# Patient Record
Sex: Female | Born: 1955 | Race: Black or African American | Hispanic: No | Smoking: Former smoker
Health system: Southern US, Community
[De-identification: ages and names within clinical notes are randomized; demographics above are authoritative.]

## PROBLEM LIST (undated history)

## (undated) DIAGNOSIS — Z0000 Encounter for general adult medical examination without abnormal findings: Secondary | ICD-10-CM

## (undated) DIAGNOSIS — I10 Essential (primary) hypertension: Secondary | ICD-10-CM

## (undated) DIAGNOSIS — M1990 Unspecified osteoarthritis, unspecified site: Secondary | ICD-10-CM

## (undated) DIAGNOSIS — Z1231 Encounter for screening mammogram for malignant neoplasm of breast: Secondary | ICD-10-CM

## (undated) DIAGNOSIS — M545 Low back pain, unspecified: Secondary | ICD-10-CM

## (undated) DIAGNOSIS — M25539 Pain in unspecified wrist: Secondary | ICD-10-CM

## (undated) DIAGNOSIS — E669 Obesity, unspecified: Secondary | ICD-10-CM

## (undated) DIAGNOSIS — M7661 Achilles tendinitis, right leg: Secondary | ICD-10-CM

## (undated) DIAGNOSIS — M419 Scoliosis, unspecified: Secondary | ICD-10-CM

## (undated) DIAGNOSIS — E785 Hyperlipidemia, unspecified: Secondary | ICD-10-CM

## (undated) HISTORY — DX: Essential (primary) hypertension: I10

## (undated) HISTORY — PX: BREAST LUMPECTOMY: SHX2

## (undated) HISTORY — DX: Scoliosis, unspecified: M41.9

## (undated) HISTORY — DX: Encounter for screening mammogram for malignant neoplasm of breast: Z12.31

## (undated) HISTORY — DX: Low back pain: M54.5

## (undated) HISTORY — DX: Encounter for general adult medical examination without abnormal findings: Z00.00

## (undated) HISTORY — DX: Unspecified osteoarthritis, unspecified site: M19.90

## (undated) HISTORY — DX: Low back pain, unspecified: M54.50

## (undated) HISTORY — DX: Achilles tendinitis, right leg: M76.61

## (undated) HISTORY — DX: Pain in unspecified wrist: M25.539

## (undated) HISTORY — DX: Hyperlipidemia, unspecified: E78.5

## (undated) HISTORY — PX: TUBAL LIGATION: SHX77

---

## 1997-10-17 ENCOUNTER — Emergency Department (HOSPITAL_COMMUNITY): Admission: EM | Admit: 1997-10-17 | Discharge: 1997-10-17 | Payer: Self-pay | Admitting: Emergency Medicine

## 2007-07-18 ENCOUNTER — Emergency Department (HOSPITAL_COMMUNITY): Admission: EM | Admit: 2007-07-18 | Discharge: 2007-07-18 | Payer: Self-pay | Admitting: Family Medicine

## 2009-09-10 ENCOUNTER — Emergency Department (HOSPITAL_COMMUNITY): Admission: EM | Admit: 2009-09-10 | Discharge: 2009-09-10 | Payer: Self-pay | Admitting: Family Medicine

## 2010-09-02 ENCOUNTER — Emergency Department (HOSPITAL_COMMUNITY)
Admission: EM | Admit: 2010-09-02 | Discharge: 2010-09-02 | Disposition: A | Discharge status: Home / Self Care | Payer: Medicaid Other | Attending: Emergency Medicine | Admitting: Emergency Medicine

## 2010-09-02 DIAGNOSIS — Z711 Person with feared health complaint in whom no diagnosis is made: Secondary | ICD-10-CM | POA: Insufficient documentation

## 2010-09-02 LAB — URINALYSIS, ROUTINE W REFLEX MICROSCOPIC
Bilirubin Urine: NEGATIVE
Glucose, UA: NEGATIVE mg/dL
Ketones, ur: NEGATIVE mg/dL
Protein, ur: NEGATIVE mg/dL
Specific Gravity, Urine: 1.022 (ref 1.005–1.030)
Urobilinogen, UA: 1 mg/dL (ref 0.0–1.0)
pH: 6 (ref 5.0–8.0)

## 2010-09-02 LAB — URINE MICROSCOPIC-ADD ON

## 2010-09-04 LAB — POCT I-STAT, CHEM 8
Calcium, Ion: 1.14 mmol/L (ref 1.12–1.32)
Creatinine, Ser: 1 mg/dL (ref 0.4–1.2)
Glucose, Bld: 130 mg/dL — ABNORMAL HIGH (ref 70–99)
HCT: 37 % (ref 36.0–46.0)
Hemoglobin: 12.6 g/dL (ref 12.0–15.0)
Sodium: 141 mEq/L (ref 135–145)
TCO2: 27 mmol/L (ref 0–100)

## 2010-11-22 ENCOUNTER — Emergency Department (HOSPITAL_COMMUNITY): Payer: Medicaid Other

## 2010-11-22 ENCOUNTER — Emergency Department (HOSPITAL_COMMUNITY)
Admission: EM | Admit: 2010-11-22 | Discharge: 2010-11-23 | Disposition: A | Discharge status: Home / Self Care | Payer: Medicaid Other | Attending: Emergency Medicine | Admitting: Emergency Medicine

## 2010-11-22 DIAGNOSIS — R1013 Epigastric pain: Secondary | ICD-10-CM | POA: Insufficient documentation

## 2010-11-22 DIAGNOSIS — R112 Nausea with vomiting, unspecified: Secondary | ICD-10-CM | POA: Insufficient documentation

## 2010-11-22 DIAGNOSIS — E669 Obesity, unspecified: Secondary | ICD-10-CM | POA: Insufficient documentation

## 2010-11-22 DIAGNOSIS — K219 Gastro-esophageal reflux disease without esophagitis: Secondary | ICD-10-CM | POA: Insufficient documentation

## 2010-11-22 LAB — CBC
HCT: 38.5 % (ref 36.0–46.0)
Hemoglobin: 13.2 g/dL (ref 12.0–15.0)
MCH: 29.7 pg (ref 26.0–34.0)
Platelets: 324 10*3/uL (ref 150–400)
RBC: 4.44 MIL/uL (ref 3.87–5.11)
WBC: 10.9 10*3/uL — ABNORMAL HIGH (ref 4.0–10.5)

## 2010-11-22 LAB — URINALYSIS, ROUTINE W REFLEX MICROSCOPIC
Hgb urine dipstick: NEGATIVE
Specific Gravity, Urine: 1.021 (ref 1.005–1.030)
Urobilinogen, UA: 1 mg/dL (ref 0.0–1.0)
pH: 5 (ref 5.0–8.0)

## 2010-11-22 LAB — COMPREHENSIVE METABOLIC PANEL
ALT: 19 U/L (ref 0–35)
AST: 19 U/L (ref 0–37)
Albumin: 4 g/dL (ref 3.5–5.2)
CO2: 29 mEq/L (ref 19–32)
Calcium: 10 mg/dL (ref 8.4–10.5)
GFR calc non Af Amer: 50 mL/min — ABNORMAL LOW (ref >60)
Sodium: 139 mEq/L (ref 135–145)
Total Bilirubin: 0.4 mg/dL (ref 0.3–1.2)
Total Protein: 8.4 g/dL — ABNORMAL HIGH (ref 6.0–8.3)

## 2010-11-22 LAB — DIFFERENTIAL
Basophils Relative: 0 % (ref 0–1)
Eosinophils Relative: 2 % (ref 0–5)
Monocytes Relative: 5 % (ref 3–12)

## 2010-11-22 LAB — LIPASE, BLOOD: Lipase: 27 U/L (ref 11–59)

## 2010-11-22 LAB — URINE MICROSCOPIC-ADD ON

## 2010-11-22 LAB — GLUCOSE, CAPILLARY

## 2010-11-25 ENCOUNTER — Encounter: Payer: Self-pay | Admitting: Internal Medicine

## 2010-11-25 ENCOUNTER — Ambulatory Visit (INDEPENDENT_AMBULATORY_CARE_PROVIDER_SITE_OTHER): Payer: Medicaid Other | Admitting: Internal Medicine

## 2010-11-25 DIAGNOSIS — M7661 Achilles tendinitis, right leg: Secondary | ICD-10-CM | POA: Insufficient documentation

## 2010-11-25 DIAGNOSIS — M419 Scoliosis, unspecified: Secondary | ICD-10-CM | POA: Insufficient documentation

## 2010-11-25 DIAGNOSIS — M1990 Unspecified osteoarthritis, unspecified site: Secondary | ICD-10-CM

## 2010-11-25 DIAGNOSIS — M7660 Achilles tendinitis, unspecified leg: Secondary | ICD-10-CM

## 2010-11-25 DIAGNOSIS — Z0000 Encounter for general adult medical examination without abnormal findings: Secondary | ICD-10-CM

## 2010-11-25 DIAGNOSIS — M4120 Other idiopathic scoliosis, site unspecified: Secondary | ICD-10-CM

## 2010-11-25 DIAGNOSIS — I10 Essential (primary) hypertension: Secondary | ICD-10-CM | POA: Insufficient documentation

## 2010-11-25 DIAGNOSIS — Z23 Encounter for immunization: Secondary | ICD-10-CM

## 2010-11-25 MED ORDER — HYDROCHLOROTHIAZIDE 25 MG PO TABS: 25.0000 mg | ORAL_TABLET | Freq: Every day | ORAL | Status: DC

## 2010-11-25 MED ORDER — TRAMADOL HCL 50 MG PO TABS: 50.0000 mg | ORAL_TABLET | Freq: Three times a day (TID) | ORAL | Status: DC | PRN

## 2010-11-25 NOTE — Assessment & Plan Note (Addendum)
Does not know if she has ever had her cholesterol checked, so she will come back on Friday for fasting lipids.   History of possible high blood sugars by outside provider, polyuria, and obesity.  Will check a fasting glucose to rule out diabetes. Patient interested in a colonoscopy once she has medicaid or an orange card.    Plan: Fasting glucose Fasting lipid panel Tdap today

## 2010-11-25 NOTE — Progress Notes (Deleted)
  Subjective:    Patient ID: Tiffany Pacheco, female    DOB: 10/10/1955, 55 y.o.   MRN: [id number]  HPI    Review of Systems     Objective:   Physical Exam        Assessment & Plan:

## 2010-11-25 NOTE — Assessment & Plan Note (Signed)
History given by patient from previous visit with [facility] orthopaedics as well as exam consistent with bilateral knee osteoarthritis, L>R.    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

## 2010-11-25 NOTE — Assessment & Plan Note (Addendum)
Physical exam is consistent with scoliosis.  Her back pain is significantly limiting her quality of life, so it warrants further evaluation and treatment.  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         Patient agreed to call us when her orange card or medicaid is set, so will wait to schedule this referral until then. Stop taking any NSAIDs (Aleve, Motrin, Advil) in the setting of borderline high creatinine 1.13 Stop taking gabapentin since it makes her sleepy and does not seem to be helping her musculoskeletal pain.  Her pain does not seem to have much of a neuropathic component.

## 2010-11-25 NOTE — Assessment & Plan Note (Signed)
BP is too low today.  Given history of high BP measurements and possible related headaches, will continue HCTZ but discontinue lisinopril for now.   Follow-up in 2 months to monitor HTN.

## 2010-11-25 NOTE — Patient Instructions (Signed)
Please return to clinic in 2 months.  Please return on Friday morning to have blood work.  Please call us once you have the orange card or medicaid and we will schedule your referral to sports medicine.  Please stop taking Gabapentin and Aleve.

## 2010-11-25 NOTE — Assessment & Plan Note (Signed)
Physical exam is consistent with insertional tendonitis of R achilles tendon.  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

## 2010-11-25 NOTE — Progress Notes (Signed)
Subjective:    Patient ID: Tiffany Pacheco, female    DOB: [birth year], 55 y.o.   MRN: [id number]  HPI Ms. Feldt is a 55 year old woman with a history of hypertension and osteoarthritis who presents to establish care in our clinic and with concern that she has may have diabetes.  She suspects diabetes because she has seen a nurse practitioner Hedwig Morton at an outside facility who found some high blood sugars.  Ms. Breault does have polyuria, but this is relatively recent and she thinks it is due to her new blood pressure medications lisinopril-hctz.  She also has occasional abdominal pain, but not very frequently.  She also notes occasional numbness in her toes, less than daily but at least once a week.  Of note, she has had glucoses between 95-130 at two ED visits this year.  She presented to the ED on the 22nd with nausea and vomiting, as well as burning pain in her throat.  She had a normal electrolytes and a normal EKG except for a PVC.  She was sent home with pepcid and prilosec.   She previously presented to the ED in May for headache and was found to have high blood pressure.  The initial pressure is not recorded in their note, but a recheck was 142/87.  She saw Hedwig Morton who found her pressure to be 131/103 per patient, and Ms. Sterba was started on Lisinopril-HCTZ 20-25mg .  She has been taking this since May.  She notes that before taking this she had headaches but they have since not been a problem.    Ms. Empson also has a history of back, L knee, and R ankle pain.  She has had back pain her whole life.  As a child her parents believed she had scoliosis, but she did not see a doctor for this until about 10 years ago when she saw chiropracter Dr. Okey Dupre in [place].  From him she received "some sort of electrotherapy".  Her back pain in aching and 6/10 currently, 10/10 at its worst.  The left knee pain has been a problem for a while.  She saw [facility] in either 2010 or 2011 for  this, who told her it was due to osteoarthritis.  They gave her an injection in the knee which helped.  The right ankle pain has been constant since the start of the year and a problem ever since she woke up in the morning with it one morning in 2009.  She remembers no associated trauma at the time, and she has never had an ankle sprain.    Her musculoskeletal pain significantly limits her quality of life.  She used to love to walk every day but now she never walks.  On bad days she sometimes just stays in bed.  She tried two Aleve tablets (over-the-counter) twice per day for a month or two back in February-March, but this did not help very much.  She has also tried tylenol which does not help very much.  Hedwig Morton prescribed Neurontin 300mg  TID.  Ms. Winters says this makes her too tired and she only takes it night.  It does not help her pain very much if at all.    She has frequent heartburn after eating.  This is mildly improved since being started on antacids from the ED.    Fam Hx: Mother: DM and HTN Aunt died of MI in her 35s Brother takes unknown "heart medication" Uncle and cousin died of  lung CA (both smoked) Uncle: DM  Soc Hx: She smoked for 2 yrs 1/2 ppd but stopped 20 years ago.  No alcohol or drug use. Her physical activity is limited by both pain and SOB.  She cannot climb a flight of stairs because of pain and if she did she thinks she would be SOB. She does not work currently.    Review of Systems Review of Systems - History obtained from the patient General ROS: negative for - chills, fatigue, fever or weight loss Respiratory ROS: no cough, shortness of breath, or wheezing Cardiovascular ROS: see HPI.  No palpitations. Gastrointestinal ROS: no abdominal pain, change in bowel habits, or black or bloody stools Genito-Urinary ROS: no dysuria, trouble voiding, or hematuria Musculoskeletal ROS: see HPI Neurological ROS: Positive for: see HPI.  negative for - memory loss or  weakness Dermatological ROS: negative rash        Objective:   Physical Exam  Filed Vitals:   11/25/10 1451  BP: 101/77  Pulse: 94  Temp: 96.5 F (35.8 C)   General: alert, well-developed, and cooperative to examination.  Head: normocephalic and atraumatic.  Eyes: vision grossly intact, pupils equal, pupils round, pupils reactive to light, no injection and anicteric.  Mouth: pharynx pink and moist, no erythema, and no exudates.   Neck: Full ROM in flexion and rotation.  ROM in extension limited by pain in lower back.  No JVD.   Lungs: normal respiratory effort, no accessory muscle use, normal breath sounds, no crackles, and no wheezes. Heart: normal rate, regular rhythm, no murmur, no gallop, and no rub.  Abdomen: soft, non-tender, normal bowel sounds, no distention, no guarding, no rebound tenderness.  Msk:  Back non-tender to palpation, but pain with standing and any back movement.   L knee has pain with flexion and extension, but no definite restriction of ROM.  Possible swelling surrounding bottom of patella laterally.  Negative McMurray's test.  Tender to palpation around most of the joint except over patella.  R knee has pain in extension but not flexion, no restriction of ROM.  No swelling.  Negative McMurray's.  Tender to palpation laterally and medially, but much less so than L knee. R ankle: tender to palpation only over the heel, most severe over insertion of achilles tendon.  Non tender around ankle medially, laterally, and anteriorly.  Plantarflexion against resistance causes pain. No joint warmth, no redness over any joints.   Pulses: 2+ DP/PT pulses bilaterally Extremities: No cyanosis, clubbing, edema  Neurologic: alert & oriented X3, cranial nerves II-XII intact, strength normal in all extremities, sensation intact to light touch.  Gait limited by back pain.  Sensation in fingertips and toes intact and symmetric to light touch and pinprick. Skin: turgor normal  and no rashes.  Psych: Oriented X3, memory intact for recent and remote, normally interactive, good eye contact, not anxious appearing, and not depressed appearing.        Assessment & Plan:

## 2010-11-26 NOTE — Progress Notes (Signed)
Assessment and plan discussed with Dr. Larey Seat. I agree with his entire assessment and plan.

## 2010-11-27 ENCOUNTER — Other Ambulatory Visit: Payer: Self-pay

## 2010-11-27 DIAGNOSIS — Z0000 Encounter for general adult medical examination without abnormal findings: Secondary | ICD-10-CM

## 2010-11-27 LAB — LIPID PANEL
Cholesterol: 241 mg/dL — ABNORMAL HIGH (ref 0–200)
HDL: 38 mg/dL — ABNORMAL LOW (ref >39)

## 2010-11-28 LAB — GLUCOSE, RANDOM: Glucose, Bld: 99 mg/dL (ref 70–99)

## 2010-12-17 ENCOUNTER — Telehealth: Payer: Self-pay | Admitting: *Deleted

## 2010-12-17 NOTE — Telephone Encounter (Signed)
Pt states she was given  tramadol for pain at last visit 7/25. Hx AO and back pain.  She has taken codeine in the past and is having the same side effects with the tramadol.  Sweating, nausea, and weakness.  She would like to try something else. Pharmacy - [facility] on [address]. Pt # [id number]

## 2010-12-18 ENCOUNTER — Other Ambulatory Visit: Payer: Self-pay | Admitting: Internal Medicine

## 2010-12-18 DIAGNOSIS — M1990 Unspecified osteoarthritis, unspecified site: Secondary | ICD-10-CM

## 2010-12-18 MED ORDER — DICLOFENAC SODIUM 1 % TD GEL: 1.0000 "application " | Freq: Four times a day (QID) | TRANSDERMAL | Status: DC | PRN

## 2010-12-18 NOTE — Telephone Encounter (Signed)
Patient may try Voltaren Gel applied to the back and/or knee up to four times per day.  I send in the prescription to her [facility] pharmacy.  She may also try Tylenol 500mg  two tablets up to four times per day as needed for pain.  Appreciate the help in setting up sports medicine referral for her.    Thank you!

## 2010-12-18 NOTE — Telephone Encounter (Signed)
I talked with Dr Yaakov Guthrie and he wanted me to check on Sports med referral. Pt does not have an orange card as she has not brought in her paperwork. It is noted that pt has Medicaid ....... So will not need the orange card. Lela calling Sports med to schedule appointment

## 2010-12-18 NOTE — Telephone Encounter (Signed)
Pt informed and voices understanding 

## 2011-01-07 ENCOUNTER — Ambulatory Visit (INDEPENDENT_AMBULATORY_CARE_PROVIDER_SITE_OTHER): Payer: Medicaid Other | Admitting: Family Medicine

## 2011-01-07 DIAGNOSIS — M4120 Other idiopathic scoliosis, site unspecified: Secondary | ICD-10-CM

## 2011-01-07 DIAGNOSIS — M419 Scoliosis, unspecified: Secondary | ICD-10-CM

## 2011-01-07 DIAGNOSIS — M545 Low back pain, unspecified: Secondary | ICD-10-CM

## 2011-01-07 MED ORDER — AMITRIPTYLINE HCL 25 MG PO TABS: 25.0000 mg | ORAL_TABLET | Freq: Every day | ORAL | Status: DC

## 2011-01-07 MED ORDER — METHYLPREDNISOLONE (PAK) 4 MG PO TABS: ORAL_TABLET | ORAL | Status: AC

## 2011-01-07 NOTE — Progress Notes (Signed)
  Subjective:    Patient ID: Tiffany Pacheco, female    DOB: 02/04/1956, 55 y.o.   MRN: [id number]  HPI  Tiffany Pacheco is a 55 yo female patient complaining of low back pain for the last year. The pain has worsened in the last three-month period the pain is located low back. She denies any wrist or her back. The pain is constant pain. 6/10 today. Radiated on and off to her legs not treatment. Pain is worse with activity, walking, and weightbearing activities, he improved by resting. She has not had any kind of treatment for her back pain. Stated that she has history of scoliosis in the past which did not require treatment.  Patient Active Problem List  Diagnoses  . Hypertension  . Scoliosis  . Healthcare maintenance  . Tendonitis, Achilles, right  . Osteoarthritis   Current Outpatient Prescriptions on File Prior to Visit  Medication Sig Dispense Refill  . diclofenac sodium (VOLTAREN) 1 % GEL Apply 1 application topically 4 (four) times daily as needed (Apply to knee and/or back up to four times per day for pain.).  100 g  3  . famotidine (PEPCID) 20 MG tablet Take 20 mg by mouth daily.        . hydrochlorothiazide 25 MG tablet Take 1 tablet (25 mg total) by mouth daily.  30 tablet  6  . omeprazole (PRILOSEC OTC) 20 MG tablet Take 20 mg by mouth daily.        . traMADol (ULTRAM) 50 MG tablet Take 1 tablet (50 mg total) by mouth every 8 (eight) hours as needed for pain.  45 tablet  1   Allergies  Allergen Reactions  . Codeine     Hypotension and passed out.     Review of Systems  Constitutional: Negative for chills, diaphoresis and fatigue.  Musculoskeletal: Positive for back pain. Negative for joint swelling and gait problem.  Neurological: Negative for weakness and numbness.       Objective:   Physical Exam  Constitutional: She appears well-developed and well-nourished.       There were no vitals taken for this visit.   Eyes: EOM are normal.  Pulmonary/Chest: Effort normal.    Musculoskeletal:       Low back with intact skin. No swelling, no hematomas. FROM for flexion, extension, rotation and lateralization. Pain with extension No Tenderness to palpation on SI joint area B/L. Faber test negative for SI joint pain. Straight leg raise negative B/L. Strength 5/5 for hip flexion and extension, 5/5 for knee flexion and extension, 5/5 for ankle plantar and dorsal flexion B/L. DTR patellar and achilles II/IV B/L Sensation intact distally B/L. No leg discrepancy.   Neurological: She is alert.  Skin: Skin is warm.  Psychiatric: She has a normal mood and affect. Judgment normal.          Assessment & Plan:   1. Low back pain  amitriptyline (ELAVIL) 25 MG tablet, methylPREDNIsolone (MEDROL DOSPACK) 4 MG tablet, DG Lumbar Spine Complete, Ambulatory referral to Physical Therapy  2. Scoliosis  PT    F/u after PT completaed

## 2011-01-08 DIAGNOSIS — M545 Low back pain: Secondary | ICD-10-CM | POA: Insufficient documentation

## 2011-01-18 ENCOUNTER — Ambulatory Visit
Admission: RE | Admit: 2011-01-18 | Discharge: 2011-01-18 | Disposition: A | Discharge status: Home / Self Care | Payer: Medicaid Other | Source: Ambulatory Visit | Attending: Family Medicine | Admitting: Family Medicine

## 2011-01-22 ENCOUNTER — Telehealth: Payer: Self-pay | Admitting: *Deleted

## 2011-01-22 NOTE — Telephone Encounter (Signed)
Pt is requesting a refill on Lisino-HCTZ 20-25 mg tablets # 30.  Take one tablet by mouth every day.

## 2011-01-26 ENCOUNTER — Ambulatory Visit (INDEPENDENT_AMBULATORY_CARE_PROVIDER_SITE_OTHER): Payer: Medicaid Other | Admitting: Internal Medicine

## 2011-01-26 ENCOUNTER — Other Ambulatory Visit (HOSPITAL_COMMUNITY)
Admission: RE | Admit: 2011-01-26 | Discharge: 2011-01-26 | Disposition: A | Discharge status: Home / Self Care | Payer: Medicaid Other | Source: Ambulatory Visit | Attending: Internal Medicine | Admitting: Internal Medicine

## 2011-01-26 ENCOUNTER — Encounter: Payer: Self-pay | Admitting: Internal Medicine

## 2011-01-26 VITALS — BP 119/85 | HR 104 | Temp 99.9°F | Ht 63.0 in | Wt 219.7 lb

## 2011-01-26 DIAGNOSIS — E785 Hyperlipidemia, unspecified: Secondary | ICD-10-CM

## 2011-01-26 DIAGNOSIS — Z0000 Encounter for general adult medical examination without abnormal findings: Secondary | ICD-10-CM | POA: Insufficient documentation

## 2011-01-26 DIAGNOSIS — Z01419 Encounter for gynecological examination (general) (routine) without abnormal findings: Secondary | ICD-10-CM | POA: Insufficient documentation

## 2011-01-26 DIAGNOSIS — M545 Low back pain: Secondary | ICD-10-CM

## 2011-01-26 DIAGNOSIS — Z1231 Encounter for screening mammogram for malignant neoplasm of breast: Secondary | ICD-10-CM | POA: Insufficient documentation

## 2011-01-26 DIAGNOSIS — I10 Essential (primary) hypertension: Secondary | ICD-10-CM

## 2011-01-26 MED ORDER — AMITRIPTYLINE HCL 25 MG PO TABS: 25.0000 mg | ORAL_TABLET | Freq: Every day | ORAL | Status: DC

## 2011-01-26 MED ORDER — PRAVASTATIN SODIUM 20 MG PO TABS: 20.0000 mg | ORAL_TABLET | Freq: Every day | ORAL | Status: DC

## 2011-01-26 NOTE — Progress Notes (Signed)
Subjective:   Patient ID: Tiffany Pacheco female   DOB: 1956/02/28 55 y.o.   MRN: [id number]  HPI: [name] is a 55 y.o. woman who presents to clinic today for follow up of her chronic medical problems.  She states that she saw Dr Ashley Jacobs and was started has been taking the amitriptyline and that helps her sleep.  She states that she has been taking her other medications as well and has no problems with side effects.    She states that she had right sided lumpectomy 25-30 years ago because of benign lesions in her breast.  Since that time she has not had a mammogram.  She has no family history of breast, colon, or ovarian cancer.    She had a fasting lipid panel done at her visit in July and her LDL was quite high.  She has no family history of early MI and is a former smoker.  She has hypertension as well so her risk factors her goal for her LDL would be <130.    She states that she had a pap smear but does not remember when it is.  She is not sexual active but has no history of abnormal pap smears.   She also has not had a colon cancer screening done.    Past Medical History  Diagnosis Date  . Hypertension   . Arthritis   . Scoliosis    Current Outpatient Prescriptions  Medication Sig Dispense Refill  . amitriptyline (ELAVIL) 25 MG tablet Take 1 tablet (25 mg total) by mouth at bedtime.  30 tablet  0  . diclofenac sodium (VOLTAREN) 1 % GEL Apply 1 application topically 4 (four) times daily as needed (Apply to knee and/or back up to four times per day for pain.).  100 g  3  . famotidine (PEPCID) 20 MG tablet Take 20 mg by mouth daily.        . hydrochlorothiazide 25 MG tablet Take 1 tablet (25 mg total) by mouth daily.  30 tablet  6  . omeprazole (PRILOSEC OTC) 20 MG tablet Take 20 mg by mouth daily.        . traMADol (ULTRAM) 50 MG tablet Take 1 tablet (50 mg total) by mouth every 8 (eight) hours as needed for pain.  45 tablet  1   Family History  Problem Relation Age of Onset    . Diabetes Mother   . Hypertension Mother   . Cancer Cousin    History   Social History  . Marital Status: Divorced    Spouse Name: N/A    Number of Children: N/A  . Years of Education: N/A   Social History Main Topics  . Smoking status: Former Smoker -- 0.5 packs/day for 2 years    Types: Cigarettes    Quit date: 11/24/1988  . Smokeless tobacco: None  . Alcohol Use: No  . Drug Use: No  . Sexually Active: None   Other Topics Concern  . None   Social History Narrative  . None   Review of Systems: Negative except as noted in the HPI.   Objective:  Physical Exam: Filed Vitals:   01/26/11 1507  BP: 119/85  Pulse: 104  Temp: 99.9 F (37.7 C)  TempSrc: Oral  Height: 5\' 3"  (1.6 m)  Weight: 219 lb 11.2 oz (99.655 kg)   Constitutional: Vital signs reviewed.  Patient is a well-developed and well-nourished woman in no acute distress and cooperative with exam. Alert and oriented  x3.  Head: Normocephalic and atraumatic Ear: TM normal bilaterally Mouth: no erythema or exudates, MMM Eyes: PERRL, EOMI, conjunctivae normal, No scleral icterus.  Neck: Supple, Trachea midline normal ROM, No JVD, mass, thyromegaly, or carotid bruit present.  Cardiovascular: RRR, S1 normal, S2 normal, no MRG, pulses symmetric and intact bilaterally Pulmonary/Chest: Large breasts noted.  CTAB, no wheezes, rales, or rhonchi Abdominal: Soft. Non-tender, non-distended, bowel sounds are normal, no masses, organomegaly, or guarding present.  GU: no CVA tenderness Musculoskeletal: mild tenderness noted in the paraspinous muscles in the lumbar spine.  No joint deformities, erythema, or stiffness, ROM full and no nontender Hematology: no cervical, inginal, or axillary adenopathy.  Neurological: A&O x3, Strength is normal and symmetric bilaterally, cranial nerve II-XII are grossly intact, no focal motor deficit, sensory intact to light touch bilaterally.  Skin: Warm, dry and intact. No rash, cyanosis, or  clubbing.  Psychiatric: Normal mood and affect. speech and behavior is normal. Judgment and thought content normal. Cognition and memory are normal.   Assessment & Plan:

## 2011-01-26 NOTE — Patient Instructions (Addendum)
Start Pravastatin 20 mg tablets 1 tablet daily for your cholesterol.  We will recheck your cholesterol in 3 months to see how the medication is working for you.    Watch your fried food intake as well as choosing healthier proteins including chicken, pork, or fish as opposed to beef.    Continue taking all of your other medications as prescribed.    We will make a referral to have your mammogram at the [facility].  Follow up in about 1 month to finish off your routine health maintenance including referral for colonoscopy.

## 2011-01-27 ENCOUNTER — Other Ambulatory Visit: Payer: Self-pay | Admitting: Internal Medicine

## 2011-01-27 NOTE — Telephone Encounter (Signed)
She does not take Lisinopril/HCTZ combo.  She was taken off that and only continued on HCTZ.  If you could call her and inform her that we had made that switch the time before I saw her.

## 2011-02-02 ENCOUNTER — Telehealth: Payer: Self-pay | Admitting: *Deleted

## 2011-02-02 NOTE — Telephone Encounter (Signed)
Call to pt to inform her that she is no longer on the HCTZ-Lisinopril combination.  She is only per her last visit with Dr. Tonny Branch on HCTZ.  Message was left for the pt to call the Clinics.

## 2011-02-03 ENCOUNTER — Telehealth: Payer: Self-pay | Admitting: *Deleted

## 2011-02-03 NOTE — Telephone Encounter (Signed)
Return call from pt.  Pt was informed that she is only su[ossed to be on the HCTZ and not the combination HCTZ_Lisinopril.  Pt voiced understanding of.  Said that she was only taking the plain HCTZ anyway.

## 2011-02-04 ENCOUNTER — Ambulatory Visit (INDEPENDENT_AMBULATORY_CARE_PROVIDER_SITE_OTHER): Payer: Medicaid Other | Admitting: Family Medicine

## 2011-02-04 VITALS — BP 130/80

## 2011-02-04 DIAGNOSIS — M47817 Spondylosis without myelopathy or radiculopathy, lumbosacral region: Secondary | ICD-10-CM

## 2011-02-04 DIAGNOSIS — M47816 Spondylosis without myelopathy or radiculopathy, lumbar region: Secondary | ICD-10-CM

## 2011-02-04 DIAGNOSIS — M545 Low back pain: Secondary | ICD-10-CM

## 2011-02-04 NOTE — Progress Notes (Signed)
Subjective:    Patient ID: Tiffany Pacheco, female    DOB: 1955-11-18, 55 y.o.   MRN: [id number]   HPI  Coming to f/u on her back pain. Doing slightly better  .Tiffany Pacheco is coming to F/U on her low back pain, we started her on a medrol dose pack, low back HEP, elavil 25 mg HS. She states that she is 20% better, the medrol dose pack helped her while she was on it. She is tolerating the elavil well and is helping her to sleep at night. Her pain is located in the low back, no radiation 3/10,on and off , worse with activities, no numbness no tingling. No fecal or urinary incontinence. X ray of her L spine were done which reported: Normal lumbar alignment. Negative for fracture. Disc  spaces are intact. Mild facet degeneration at L5-S1. Negative for  pars defect. Mild levoscoliosis. Degenerative change in the SI  joints.    Patient Active Problem List  Diagnoses  . Hypertension  . Scoliosis  . Healthcare maintenance  . Tendonitis, Achilles, right  . Osteoarthritis  . Low back pain  . Other screening mammogram  . Hyperlipidemia  . Health maintenance examination   Current Outpatient Prescriptions on File Prior to Visit  Medication Sig Dispense Refill  . amitriptyline (ELAVIL) 25 MG tablet Take 1 tablet (25 mg total) by mouth at bedtime.  30 tablet  6  . diclofenac sodium (VOLTAREN) 1 % GEL Apply 1 application topically 4 (four) times daily as needed (Apply to knee and/or back up to four times per day for pain.).  100 g  3  . famotidine (PEPCID) 20 MG tablet Take 20 mg by mouth daily.        . hydrochlorothiazide 25 MG tablet Take 1 tablet (25 mg total) by mouth daily.  30 tablet  6  . omeprazole (PRILOSEC OTC) 20 MG tablet Take 20 mg by mouth daily.        . pravastatin (PRAVACHOL) 20 MG tablet Take 1 tablet (20 mg total) by mouth daily.  30 tablet  4   Allergies  Allergen Reactions  . Codeine     Hypotension and passed out.       Review of Systems  Constitutional: Negative for  fever, chills, diaphoresis and fatigue.  Musculoskeletal: Negative for myalgias, joint swelling and gait problem.  Neurological: Negative for tremors, weakness and numbness.       Objective:   Physical Exam  Constitutional: She appears well-developed and well-nourished.       BP 130/80   Neck: Normal range of motion. Neck supple.  Pulmonary/Chest: Effort normal.  Musculoskeletal:         Low back with intact skin. No swelling, no hematomas. FROM for flexion, extension, rotation and lateralization. Pain with extension No Tenderness to palpation on SI joint area B/L. Faber test negative for SI joint pain. Straight leg raise negative B/L. Strength 5/5 for hip flexion and extension, 5/5 for knee flexion and extension, 5/5 for ankle plantar and dorsal flexion B/L. DTR patellar and achilles II/IV B/L Sensation intact distally B/L. No leg discrepancy.    Neurological: She is alert.  Skin: Skin is warm. No rash noted. No erythema. No pallor.          Assessment & Plan:   1. Low back pain   2. Facet arthritis of lumbar region    Cont low back HEP Tylenol PRN for pain Tiffany Pacheco has very  large breast, a breast reduction  surgery may benefit her back problem. This was discussed with her. She agrees. F/U PRN

## 2011-02-05 NOTE — Assessment & Plan Note (Signed)
Lab Results  Component Value Date   NA 139 11/22/2010   K 3.5 11/22/2010   CL 100 11/22/2010   CO2 29 11/22/2010   BUN 17 11/22/2010   CREATININE 1.13* 11/22/2010    BP Readings from Last 3 Encounters:  02/04/11 130/80  01/26/11 119/85  11/25/10 101/77    Assessment: Hypertension control:  mildly elevated  Progress toward goals:  unchanged Barriers to meeting goals:  lack of understanding of disease management  Plan: Hypertension treatment:  continue current medications

## 2011-02-05 NOTE — Assessment & Plan Note (Signed)
Lab Results  Component Value Date   CHOL 241* 11/27/2010   HDL 38* 11/27/2010   LDLCALC 180* 11/27/2010   TRIG 113 11/27/2010   CHOLHDL 6.3 11/27/2010   Her LDL cholesterol is above her goal of <130.  We will start her on Pravastatin 20 mg and have her follow up with her PCP.

## 2011-02-05 NOTE — Assessment & Plan Note (Signed)
>>  ASSESSMENT AND PLAN FOR PREVENTATIVE HEALTH CARE WRITTEN ON 02/05/2011  4:56 PM BY PRIBULA, CHRISTOPHER, MD  We will do her Pap smear today to get her up to date.  She needs to get her Medicaid card changed over to our clinic then we can get her a referral to GI for a colonoscopy for screening.

## 2011-02-05 NOTE — Assessment & Plan Note (Signed)
Her back pain is improved with the addition of amitriptyline.  She was also encouraged to work with weight loss and physical therapy to help strengthen her back.

## 2011-02-05 NOTE — Assessment & Plan Note (Signed)
We will do her Pap smear today to get her up to date.  She needs to get her Medicaid card changed over to our clinic then we can get her a referral to GI for a colonoscopy for screening.

## 2011-02-10 ENCOUNTER — Ambulatory Visit (HOSPITAL_COMMUNITY): Payer: Medicaid Other

## 2011-02-24 ENCOUNTER — Ambulatory Visit (HOSPITAL_COMMUNITY): Payer: Medicaid Other | Attending: Internal Medicine

## 2011-03-26 ENCOUNTER — Other Ambulatory Visit: Payer: Self-pay | Admitting: Internal Medicine

## 2011-03-29 ENCOUNTER — Encounter: Payer: Self-pay | Admitting: Internal Medicine

## 2011-03-29 ENCOUNTER — Ambulatory Visit (INDEPENDENT_AMBULATORY_CARE_PROVIDER_SITE_OTHER): Payer: Medicaid Other | Admitting: Internal Medicine

## 2011-03-29 VITALS — BP 141/98 | HR 84 | Temp 98.2°F | Ht 60.0 in | Wt 219.4 lb

## 2011-03-29 DIAGNOSIS — M189 Osteoarthritis of first carpometacarpal joint, unspecified: Secondary | ICD-10-CM | POA: Insufficient documentation

## 2011-03-29 DIAGNOSIS — I10 Essential (primary) hypertension: Secondary | ICD-10-CM

## 2011-03-29 DIAGNOSIS — E785 Hyperlipidemia, unspecified: Secondary | ICD-10-CM

## 2011-03-29 DIAGNOSIS — R079 Chest pain, unspecified: Secondary | ICD-10-CM | POA: Insufficient documentation

## 2011-03-29 DIAGNOSIS — M545 Low back pain, unspecified: Secondary | ICD-10-CM

## 2011-03-29 DIAGNOSIS — Z0000 Encounter for general adult medical examination without abnormal findings: Secondary | ICD-10-CM

## 2011-03-29 DIAGNOSIS — M7661 Achilles tendinitis, right leg: Secondary | ICD-10-CM

## 2011-03-29 DIAGNOSIS — M1990 Unspecified osteoarthritis, unspecified site: Secondary | ICD-10-CM

## 2011-03-29 DIAGNOSIS — M7660 Achilles tendinitis, unspecified leg: Secondary | ICD-10-CM

## 2011-03-29 DIAGNOSIS — M25539 Pain in unspecified wrist: Secondary | ICD-10-CM

## 2011-03-29 MED ORDER — OMEPRAZOLE 20 MG PO CPDR: 20.0000 mg | DELAYED_RELEASE_CAPSULE | Freq: Every day | ORAL | Status: DC

## 2011-03-29 MED ORDER — ACETAMINOPHEN 500 MG PO TABS: 1000.0000 mg | ORAL_TABLET | Freq: Four times a day (QID) | ORAL | Status: AC | PRN

## 2011-03-29 MED ORDER — DICLOFENAC SODIUM 1 % TD GEL: TRANSDERMAL | Status: DC

## 2011-03-29 NOTE — Assessment & Plan Note (Signed)
Patient has a mammogram scheduled.  Declined colonoscopy at this time, but would be discussing it again in the future.

## 2011-03-29 NOTE — Assessment & Plan Note (Addendum)
Seen by sports medicine, started on Amitriptyline.  Voltaren gel helped as well but she is out of refills per pt. This condition seems to significantly restrict her quality of life and is contributing to her not holding a job.  Was given PT exercises by sports med but had not seen a PT. -PT referral -Voltaren gel refill -Amitriptyline refill -Tylenol PRN -Sports med mentioned breast reduction could help decrease her back pain at some point.  Discussed this with the patient.  She really would like to avoid another breast surgery if possible since her previous breast surgery (lumpectomy?) was a lot for her.

## 2011-03-29 NOTE — Assessment & Plan Note (Signed)
Patient has bilateral knee pain, likely due to OA.  Voltaren gel, tylenol, PT referral as for back pain.

## 2011-03-29 NOTE — Assessment & Plan Note (Signed)
>>  ASSESSMENT AND PLAN FOR PREVENTATIVE HEALTH CARE WRITTEN ON 03/29/2011  9:07 PM BY Danley Danker, MD  Patient has a mammogram scheduled.  Declined colonoscopy at this time, but would be discussing it again in the future.

## 2011-03-29 NOTE — Assessment & Plan Note (Signed)
Exertional, relieved by rest, had had episodes for a long time, two episodes in the past week.  Located centrally.  Accompanied by SOB.  Accompanied by diaphoresis and sometimes R arm pain.  Concerning for cardiac etiology in 55 y/o pt with HTN and HLD.  Most likely non-cardiac (GI?), but warrants a cardiology workup. -referral to cardiology

## 2011-03-29 NOTE — Patient Instructions (Addendum)
Return to Clinic in 2 months

## 2011-03-29 NOTE — Assessment & Plan Note (Signed)
Started on pravastatin Sept 2012.  Should recheck lipid profile after 6 months (March 2013) to evaluate efficacy and adjust dose/which statin.

## 2011-03-29 NOTE — Assessment & Plan Note (Signed)
Elements of osteoarthritis (MCP pain) as well as elements suspicious for carpal tunnel syndrome (decreased sensation in digits 1-4 on exam, numbness/tingling in fingers).  Tinel's and phalen's signs negative.   -pain control as per back pain recs -consider wrist splinting if this persists at 2 month fu

## 2011-03-29 NOTE — Assessment & Plan Note (Signed)
Tenderness remains on exam.  Sports med referral did not address this.  This is bothering the patient less today than her back, hand, and knee pain.  This should be re-addressed at future visit.

## 2011-03-29 NOTE — Assessment & Plan Note (Signed)
BP 141/98 by machine, 138/98 on manual recheck by me.  Has been in normal range previously, so this may be an outlier.  Will recheck on next visit in 2 months.  Continue HCTZ.  Will check BMET today to assess K and Cr.

## 2011-03-29 NOTE — Progress Notes (Signed)
Subjective:   Patient ID: Tiffany Pacheco female   DOB: January 20, 1956 55 y.o.   MRN: [id number]  HPI: Tiffany Pacheco is a 55 y.o. with a history of HTN, HLD, scoliosis, OA, and chronic joint/back pain who presents for follow-up.  She reports that her back pain is only mildly better since last visit.  She reports that the Voltaren gel we prescribed helped, but she has run out of it. She saw sports medicine twice in the interim since her last visit with me.  They prescribed amitriptyline, which helped her some.  She is out of this medication now.  They also gave her PT exercises, but she has not formally been to PT.    She reports that her R wrist pain/weakness has become worse.  She almost dropped a cup a few days ago.  She reports some numbness and tingling in the fingers as well as pain the the MCP joints.    She gives a new history of intermittent chest pain when review of systems is attained (she did not bring this up herself).  She says she has had this for a while, including two episodes in the past week.  She tends to have episodes when she is very stressed.  They get better if she sits down and tries to be very calm.  The pain is located centrally in her chest, and sometimes is accompanied by R arm pain.  She has accompanying sweating as well as shortness of breath.    Past Medical History  Diagnosis Date  . Hypertension   . Arthritis   . Scoliosis    Current Outpatient Prescriptions  Medication Sig Dispense Refill  . acetaminophen (TYLENOL) 500 MG tablet Take 2 tablets (1,000 mg total) by mouth every 6 (six) hours as needed for pain.  30 tablet  0  . amitriptyline (ELAVIL) 25 MG tablet Take 1 tablet (25 mg total) by mouth at bedtime.  30 tablet  6  . diclofenac sodium (VOLTAREN) 1 % GEL APPLY ONCE TOPICALLY 4 TIMES DAILY AS NEEDED( APPLY TO KNEE AND /OR BACK UP TO FOUR TIMES PER DAY FOR PAIN)  1 Tube  6  . famotidine (PEPCID) 20 MG tablet Take 20 mg by mouth daily.        .  hydrochlorothiazide 25 MG tablet Take 1 tablet (25 mg total) by mouth daily.  30 tablet  6  . omeprazole (PRILOSEC) 20 MG capsule Take 1 capsule (20 mg total) by mouth daily.  30 capsule  6  . pravastatin (PRAVACHOL) 20 MG tablet Take 1 tablet (20 mg total) by mouth daily.  30 tablet  4   Family History  Problem Relation Age of Onset  . Diabetes Mother   . Hypertension Mother   . Cancer Cousin    History   Social History  . Marital Status: Divorced    Spouse Name: N/A    Number of Children: N/A  . Years of Education: N/A   Social History Main Topics  . Smoking status: Former Smoker -- 0.5 packs/day for 2 years    Types: Cigarettes    Quit date: 11/24/1988  . Smokeless tobacco: None  . Alcohol Use: No  . Drug Use: No  . Sexually Active: None   Other Topics Concern  . None   Social History Narrative  . None   Review of Systems: Constitutional: Denies fever, chills, appetite change and fatigue.    Respiratory: Denies SOB, DOE, cough, and wheezing.  Cardiovascular: Positive for chest pain as per HPI. Gastrointestinal: Denies nausea, vomiting, abdominal pain, diarrhea, constipation, blood in stool and abdominal distention.  Genitourinary: Denies dysuria, urgency, frequency, hematuria, flank pain and difficulty urinating.    Objective:  Physical Exam: Filed Vitals:   03/29/11 1322  BP: 141/98  Pulse: 84  Temp: 98.2 F (36.8 C)  TempSrc: Oral  Height: 5' (1.524 m)  Weight: 219 lb 6.4 oz (99.519 kg)   Constitutional: Vital signs reviewed.  Patient is a well-developed and well-nourished woman in no acute distress and cooperative with exam. Alert and oriented x3.  Head: Normocephalic and atraumatic Neck: No JVD.  Cardiovascular: RRR, S1 normal, S2 normal, no MRG, pulses symmetric and intact bilaterally Pulmonary/Chest: CTAB, no wheezes, rales, or rhonchi   Hands/Wrists: Decreased sensation to pinprick and light touch in digits 1-4 in R hand compared to L hand.   Weakness in finger strength R > L.  Weakness in wrist flexion on R.  Tinel's and Phalen's signs negative bilaterally.    Back: Pain with and direction of motion.  Stands and walks gingerly.  Pain worst with extension, best with flexion of back.  Knees: L knee tender to palpation laterally (bump palpable there as well anterolaterally).  Otherwise knees non-tender to palpation.  Both knees have pain in full extension.  R heel:  Tender to palpation over achilles insertion area.  Assessment & Plan:

## 2011-03-30 LAB — BASIC METABOLIC PANEL WITH GFR
CO2: 28 mEq/L (ref 19–32)
Calcium: 9 mg/dL (ref 8.4–10.5)
Creat: 0.85 mg/dL (ref 0.50–1.10)
GFR, Est Non African American: 78 mL/min
Sodium: 141 mEq/L (ref 135–145)

## 2011-03-30 NOTE — Progress Notes (Signed)
Call to [facility].  Pharmacy had received this through E Scribe.  Angelina Ok, RN 03/30/2011 11:10 AM

## 2011-03-30 NOTE — Progress Notes (Signed)
agree with plans and notes 

## 2011-03-30 NOTE — Progress Notes (Signed)
Note to pt to get OTC per Pharmacy.  Angelina Ok, RN 03/30/2011 11:09 AM

## 2011-03-30 NOTE — Progress Notes (Signed)
Called to the [facility] pharmacy.  Angelina Ok, RN  05/29/2010 11:11 AM

## 2011-04-07 ENCOUNTER — Ambulatory Visit (HOSPITAL_COMMUNITY): Payer: Medicaid Other

## 2011-04-14 ENCOUNTER — Institutional Professional Consult (permissible substitution): Payer: Medicaid Other | Admitting: Cardiovascular Disease

## 2011-04-16 ENCOUNTER — Ambulatory Visit (INDEPENDENT_AMBULATORY_CARE_PROVIDER_SITE_OTHER): Payer: Medicaid Other | Admitting: Cardiovascular Disease

## 2011-04-16 ENCOUNTER — Encounter: Payer: Self-pay | Admitting: Cardiovascular Disease

## 2011-04-16 DIAGNOSIS — R079 Chest pain, unspecified: Secondary | ICD-10-CM

## 2011-04-16 DIAGNOSIS — R0609 Other forms of dyspnea: Secondary | ICD-10-CM

## 2011-04-16 DIAGNOSIS — R0600 Dyspnea, unspecified: Secondary | ICD-10-CM

## 2011-04-16 NOTE — Assessment & Plan Note (Signed)
Atypical Associated with palpitatons and chronic dyspnea.  Lexiscan myovue and echo

## 2011-04-16 NOTE — Patient Instructions (Signed)
Your physician has requested that you have an echocardiogram. Echocardiography is a painless test that uses sound waves to create images of your heart. It provides your doctor with information about the size and shape of your heart and how well your heart's chambers and valves are working. This procedure takes approximately one hour. There are no restrictions for this procedure.  Your physician has requested that you have a lexiscan myoview. For further information please visit [email or web address] Please follow instruction sheet, as given.  Your physician recommends that you schedule a follow-up appointment as needed with Dr. Eden Emms.

## 2011-04-16 NOTE — Progress Notes (Signed)
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  [name] is a 55 y.o. with a history of HTN, HLD, scoliosis, OA, and chronic joint/back pain  Her back pain is chronic  She reports that the Voltaren gel we prescribed helped, but she has run out of it. She saw sports medicine twice  They prescribed amitriptyline, which helped her some.  . They also gave her PT exercises, but she has not formally been to PT.   She reports that her R wrist pain/weakness has become worse. She almost dropped a cup a few days ago. She reports some numbness and tingling in the fingers as well as pain the the MCP joints.   She gave a new history of intermittent chest pain when review of systems was attained by IM last week  (she did not bring this up herself). She says she has had this for a while, including two episodes in the past week. She tends to have episodes when she is very stressed. They get better if she sits down and tries to be very calm. The pain is located centrally in her chest, and sometimes is accompanied by R arm pain. She has accompanying sweating as well as shortness of breath. Pain is very atypical, fleeting, intermitant and nonexertional  No previous cardiac w/u.  Unable to walk on treadmill due to obesity and arthritis  ROS: Denies fever, malais, weight loss, blurry vision, decreased visual acuity, cough, sputum, SOB, hemoptysis, pleuritic pain, palpitaitons, heartburn, abdominal pain, melena, lower extremity edema, claudication, or rash.  All other systems reviewed and negative   General: Affect appropriate Obese black female HEENT: normal Neck supple with no adenopathy JVP normal no bruits no thyromegaly Lungs clear with no wheezing and good diaphragmatic motion Heart:  S1/S2 no murmur,rub, gallop or click PMI normal Abdomen: benighn, BS positve, no tenderness, no AAA no bruit.  No HSM or HJR Distal pulses intact with no bruits No edema Neuro non-focal Skin warm and dry No muscular  weakness  Medications Current Outpatient Prescriptions  Medication Sig Dispense Refill  . amitriptyline (ELAVIL) 25 MG tablet Take 1 tablet (25 mg total) by mouth at bedtime.  30 tablet  6  . diclofenac sodium (VOLTAREN) 1 % GEL APPLY ONCE TOPICALLY 4 TIMES DAILY AS NEEDED( APPLY TO KNEE AND /OR BACK UP TO FOUR TIMES PER DAY FOR PAIN)  1 Tube  6  . famotidine (PEPCID) 20 MG tablet Take 20 mg by mouth daily.        . hydrochlorothiazide 25 MG tablet Take 1 tablet (25 mg total) by mouth daily.  30 tablet  6  . omeprazole (PRILOSEC) 20 MG capsule Take 1 capsule (20 mg total) by mouth daily.  30 capsule  6  . pravastatin (PRAVACHOL) 20 MG tablet Take 1 tablet (20 mg total) by mouth daily.  30 tablet  4    Allergies Codeine  Family History: Family History  Problem Relation Age of Onset  . Diabetes Mother   . Hypertension Mother   . Cancer Cousin     Social History: History   Social History  . Marital Status: Divorced    Spouse Name: N/A    Number of Children: N/A  . Years of Education: N/A   Occupational History  . Not on file.   Social History Main Topics  . Smoking status: Former Smoker -- 0.5 packs/day for 2 years    Types: Cigarettes    Quit date: 11/24/1988  . Smokeless tobacco: Not  on file  . Alcohol Use: No  . Drug Use: No  . Sexually Active: Not on file   Other Topics Concern  . Not on file   Social History Narrative  . No narrative on file    Electrocardiogram:  11/23/10  NSR nonspecific T wave changes rate 74 PVC  Assessment and Plan

## 2011-04-16 NOTE — Assessment & Plan Note (Signed)
Poor control with no documented vascular disease  Continue statin

## 2011-04-16 NOTE — Assessment & Plan Note (Signed)
Well controlled.  Continue current medications and low sodium Dash type diet.    

## 2011-04-21 ENCOUNTER — Ambulatory Visit: Payer: Medicaid Other | Admitting: Physical Therapy

## 2011-05-03 ENCOUNTER — Ambulatory Visit: Payer: Medicaid Other | Attending: Internal Medicine

## 2011-05-03 DIAGNOSIS — M2550 Pain in unspecified joint: Secondary | ICD-10-CM | POA: Insufficient documentation

## 2011-05-03 DIAGNOSIS — R293 Abnormal posture: Secondary | ICD-10-CM | POA: Insufficient documentation

## 2011-05-03 DIAGNOSIS — M2569 Stiffness of other specified joint, not elsewhere classified: Secondary | ICD-10-CM | POA: Insufficient documentation

## 2011-05-03 DIAGNOSIS — IMO0001 Reserved for inherently not codable concepts without codable children: Secondary | ICD-10-CM | POA: Insufficient documentation

## 2011-05-05 ENCOUNTER — Other Ambulatory Visit (HOSPITAL_COMMUNITY): Payer: Medicaid Other | Admitting: Radiology

## 2011-05-05 ENCOUNTER — Ambulatory Visit (HOSPITAL_COMMUNITY): Payer: Medicaid Other | Attending: Cardiology | Admitting: Radiology

## 2011-05-05 VITALS — BP 127/95 | Ht 61.0 in | Wt 214.0 lb

## 2011-05-05 DIAGNOSIS — R5383 Other fatigue: Secondary | ICD-10-CM | POA: Insufficient documentation

## 2011-05-05 DIAGNOSIS — Z8249 Family history of ischemic heart disease and other diseases of the circulatory system: Secondary | ICD-10-CM | POA: Insufficient documentation

## 2011-05-05 DIAGNOSIS — R0609 Other forms of dyspnea: Secondary | ICD-10-CM | POA: Insufficient documentation

## 2011-05-05 DIAGNOSIS — I10 Essential (primary) hypertension: Secondary | ICD-10-CM | POA: Insufficient documentation

## 2011-05-05 DIAGNOSIS — Z87891 Personal history of nicotine dependence: Secondary | ICD-10-CM | POA: Insufficient documentation

## 2011-05-05 DIAGNOSIS — E785 Hyperlipidemia, unspecified: Secondary | ICD-10-CM | POA: Insufficient documentation

## 2011-05-05 DIAGNOSIS — R000 Tachycardia, unspecified: Secondary | ICD-10-CM | POA: Insufficient documentation

## 2011-05-05 DIAGNOSIS — R42 Dizziness and giddiness: Secondary | ICD-10-CM | POA: Insufficient documentation

## 2011-05-05 DIAGNOSIS — R55 Syncope and collapse: Secondary | ICD-10-CM | POA: Insufficient documentation

## 2011-05-05 DIAGNOSIS — R5381 Other malaise: Secondary | ICD-10-CM | POA: Insufficient documentation

## 2011-05-05 DIAGNOSIS — R002 Palpitations: Secondary | ICD-10-CM | POA: Insufficient documentation

## 2011-05-05 DIAGNOSIS — R0602 Shortness of breath: Secondary | ICD-10-CM | POA: Insufficient documentation

## 2011-05-05 DIAGNOSIS — R0989 Other specified symptoms and signs involving the circulatory and respiratory systems: Secondary | ICD-10-CM | POA: Insufficient documentation

## 2011-05-05 DIAGNOSIS — R079 Chest pain, unspecified: Secondary | ICD-10-CM | POA: Insufficient documentation

## 2011-05-05 DIAGNOSIS — R61 Generalized hyperhidrosis: Secondary | ICD-10-CM | POA: Insufficient documentation

## 2011-05-05 MED ORDER — REGADENOSON 0.4 MG/5ML IV SOLN
0.4000 mg | Freq: Once | INTRAVENOUS | Status: AC
  Administered 2011-05-05: 0.4 mg via INTRAVENOUS

## 2011-05-05 MED ORDER — TECHNETIUM TC 99M TETROFOSMIN IV KIT
30.0000 | PACK | Freq: Once | INTRAVENOUS | Status: AC | PRN
  Administered 2011-05-05: 30 via INTRAVENOUS

## 2011-05-05 NOTE — Progress Notes (Signed)
[facility] SITE 3 NUCLEAR MED [address] [place] [address] [phone]  Cardiology Nuclear Med Study  Tiffany Pacheco is a 56 y.o. female [id number] 1956-01-08   Nuclear Med Background Indication for Stress Test:  Evaluation for Ischemia History:  No previous documented CAD Cardiac Risk Factors: Family History - CAD, History of Smoking, Hypertension and Lipids  Symptoms:  Chest Pressure with and without Exertion (last date of chest discomfort 2 weeks ago), Diaphoresis, Dizziness, DOE, Fatigue, Fatigue with Exertion, Near Syncope with weakness, Palpitations, Rapid HR and SOB   Nuclear Pre-Procedure Caffeine/Decaff Intake:  None NPO After: 12:00am   Lungs:  Clear IV 0.9% NS with Angio Cath:  22g  IV Site: R Antecubital x 1, tolerated well IV Started by:  Bonnita Levan, RN  Chest Size (in):  50 Cup Size: DDD  Height: 5\' 1"  (1.549 m)  Weight:  214 lb (97.07 kg)  BMI:  Body mass index is 40.44 kg/(m^2). Tech Comments:  n/a    Nuclear Med Study 1 or 2 day study: 2 day  Stress Test Type:  Lexiscan  Reading MD: Theron Arista Darriel Sinquefield,M>D>  Order Authorizing Provider:  Charlton Haws, MD  Resting Radionuclide: Technetium 76m Tetrofosmin  Resting Radionuclide Dose: 33.0 mCi 05/19/11  Stress Radionuclide:  Technetium 75m Tetrofosmin  Stress Radionuclide Dose: 33.0 mCi on 05/05/11             Stress Protocol Rest HR: 88 Stress HR: 127  Rest BP: 127/95 Stress BP: 147/104  Exercise Time (min): n/a METS: n/a   Predicted Max HR: 167 bpm % Max HR: 76.05 bpm Rate Pressure Product: 41324   Dose of Adenosine (mg):  n/a Dose of Lexiscan: 0.4 mg  Dose of Atropine (mg): n/a Dose of Dobutamine: n/a mcg/kg/min (at max HR)  Stress Test Technologist: Irean Hong, RN  Nuclear Technologist:  Domenic Polite, CNMT     Rest Procedure:  Myocardial perfusion imaging was performed at rest 45 minutes following the intravenous administration of Technetium 9m Tetrofosmin. Rest ECG:  NSR with nonspecific T-wave changes  Stress Procedure:  The patient received IV Lexiscan 0.4 mg over 15-seconds.  Technetium 74m Tetrofosmin injected at 30-seconds.  There were significant T wave changes with Lexiscan. There was a mild hypertensive response. There was chest pressure 5/10 with lexiscan that subsided in recovery. Quantitative spect images were obtained after a 45 minute delay. Stress ECG: No significant change from baseline ECG  QPS Raw Data Images:  Normal; no motion artifact; normal heart/lung ratio. Stress Images:  Normal homogeneous uptake in all areas of the myocardium. Rest Images:  Normal homogeneous uptake in all areas of the myocardium. Subtraction (SDS):  Normal Transient Ischemic Dilatation (Normal <1.22):  0.91 Lung/Heart Ratio (Normal <0.45):  0.42  Quantitative Gated Spect Images QGS EDV:  68 ml QGS ESV:  27 ml QGS cine images:  NL LV Function; NL Wall Motion QGS EF: 60%  Impression Exercise Capacity:  Lexiscan with no exercise. BP Response:  Normal blood pressure response. Clinical Symptoms:  Mild chest pain/dyspnea. ECG Impression:  Inferolateral T wave changes with lexiscan Comparison with Prior Nuclear Study: No previous nuclear study performed  Overall Impression:  Low risk stress nuclear study. With Lexiscan had inferolateral T wave changes.  Clinical significance not known     Charlton Haws

## 2011-05-07 ENCOUNTER — Other Ambulatory Visit (HOSPITAL_COMMUNITY): Payer: Medicaid Other | Admitting: Radiology

## 2011-05-10 ENCOUNTER — Other Ambulatory Visit (HOSPITAL_COMMUNITY): Payer: Medicaid Other | Admitting: Radiology

## 2011-05-10 ENCOUNTER — Encounter (HOSPITAL_COMMUNITY): Payer: Medicaid Other | Admitting: Radiology

## 2011-05-12 ENCOUNTER — Ambulatory Visit: Payer: Medicaid Other | Attending: Internal Medicine | Admitting: Rehabilitative and Restorative Service Providers"

## 2011-05-12 ENCOUNTER — Ambulatory Visit: Payer: Medicaid Other | Admitting: Physical Therapy

## 2011-05-12 ENCOUNTER — Encounter (HOSPITAL_COMMUNITY): Payer: Medicaid Other | Admitting: Radiology

## 2011-05-12 ENCOUNTER — Ambulatory Visit (HOSPITAL_COMMUNITY): Payer: Medicaid Other

## 2011-05-12 DIAGNOSIS — IMO0001 Reserved for inherently not codable concepts without codable children: Secondary | ICD-10-CM | POA: Insufficient documentation

## 2011-05-12 DIAGNOSIS — M545 Low back pain, unspecified: Secondary | ICD-10-CM | POA: Insufficient documentation

## 2011-05-12 DIAGNOSIS — M4120 Other idiopathic scoliosis, site unspecified: Secondary | ICD-10-CM | POA: Insufficient documentation

## 2011-05-19 ENCOUNTER — Ambulatory Visit (HOSPITAL_COMMUNITY): Payer: Medicaid Other | Attending: Cardiovascular Disease | Admitting: Radiology

## 2011-05-19 ENCOUNTER — Encounter: Payer: Medicaid Other | Admitting: Physical Therapy

## 2011-05-19 ENCOUNTER — Ambulatory Visit (HOSPITAL_COMMUNITY): Payer: Medicaid Other | Attending: Cardiovascular Disease

## 2011-05-19 DIAGNOSIS — R0609 Other forms of dyspnea: Secondary | ICD-10-CM | POA: Insufficient documentation

## 2011-05-19 DIAGNOSIS — R0600 Dyspnea, unspecified: Secondary | ICD-10-CM

## 2011-05-19 DIAGNOSIS — R072 Precordial pain: Secondary | ICD-10-CM

## 2011-05-19 DIAGNOSIS — R0989 Other specified symptoms and signs involving the circulatory and respiratory systems: Secondary | ICD-10-CM | POA: Insufficient documentation

## 2011-05-19 DIAGNOSIS — I10 Essential (primary) hypertension: Secondary | ICD-10-CM | POA: Insufficient documentation

## 2011-05-19 DIAGNOSIS — E785 Hyperlipidemia, unspecified: Secondary | ICD-10-CM | POA: Insufficient documentation

## 2011-05-19 DIAGNOSIS — M4120 Other idiopathic scoliosis, site unspecified: Secondary | ICD-10-CM | POA: Insufficient documentation

## 2011-05-19 DIAGNOSIS — R079 Chest pain, unspecified: Secondary | ICD-10-CM | POA: Insufficient documentation

## 2011-05-19 MED ORDER — TECHNETIUM TC 99M TETROFOSMIN IV KIT
33.0000 | PACK | Freq: Once | INTRAVENOUS | Status: AC | PRN
  Administered 2011-05-19: 33 via INTRAVENOUS

## 2011-05-26 ENCOUNTER — Ambulatory Visit: Payer: Medicaid Other

## 2011-05-26 ENCOUNTER — Telehealth: Payer: Self-pay | Admitting: Cardiovascular Disease

## 2011-05-26 NOTE — Telephone Encounter (Signed)
FU Call: Pt returning call from our office from Friday. Please return pt call to discuss further.

## 2011-05-26 NOTE — Telephone Encounter (Signed)
PT AWARE OF ECHO AND MYOVIEW RESULTS. F/U APPT  MADE PT TO SEE  DR Eden Emms  06-28-11 AT 2:00 PM/CY

## 2011-06-07 ENCOUNTER — Ambulatory Visit (HOSPITAL_COMMUNITY): Payer: Medicaid Other | Attending: Internal Medicine

## 2011-06-25 ENCOUNTER — Encounter: Payer: Self-pay | Admitting: *Deleted

## 2011-06-28 ENCOUNTER — Ambulatory Visit: Payer: Medicaid Other | Admitting: Cardiovascular Disease

## 2011-07-09 ENCOUNTER — Ambulatory Visit (HOSPITAL_COMMUNITY): Payer: Medicaid Other | Attending: Internal Medicine

## 2011-07-13 ENCOUNTER — Encounter: Payer: Self-pay | Admitting: Cardiovascular Disease

## 2011-07-19 ENCOUNTER — Ambulatory Visit (INDEPENDENT_AMBULATORY_CARE_PROVIDER_SITE_OTHER): Payer: Medicaid Other | Admitting: Internal Medicine

## 2011-07-19 ENCOUNTER — Encounter: Payer: Self-pay | Admitting: Internal Medicine

## 2011-07-19 VITALS — BP 133/89 | HR 80 | Temp 97.8°F | Ht 62.0 in | Wt 218.8 lb

## 2011-07-19 DIAGNOSIS — I10 Essential (primary) hypertension: Secondary | ICD-10-CM

## 2011-07-19 DIAGNOSIS — K219 Gastro-esophageal reflux disease without esophagitis: Secondary | ICD-10-CM

## 2011-07-19 DIAGNOSIS — M545 Low back pain: Secondary | ICD-10-CM

## 2011-07-19 DIAGNOSIS — E785 Hyperlipidemia, unspecified: Secondary | ICD-10-CM

## 2011-07-19 DIAGNOSIS — M25539 Pain in unspecified wrist: Secondary | ICD-10-CM

## 2011-07-19 MED ORDER — AMITRIPTYLINE HCL 25 MG PO TABS: 25.0000 mg | ORAL_TABLET | Freq: Every day | ORAL | Status: DC

## 2011-07-19 MED ORDER — DICLOFENAC SODIUM 1 % TD GEL: TRANSDERMAL | Status: DC

## 2011-07-19 MED ORDER — RANITIDINE HCL 150 MG PO TABS: 150.0000 mg | ORAL_TABLET | Freq: Two times a day (BID) | ORAL | Status: DC | PRN

## 2011-07-19 NOTE — Patient Instructions (Addendum)
Please return to clinic in 4 months.

## 2011-07-22 ENCOUNTER — Ambulatory Visit: Payer: Medicaid Other | Admitting: Family Medicine

## 2011-07-25 ENCOUNTER — Encounter: Payer: Self-pay | Admitting: Internal Medicine

## 2011-07-25 NOTE — Progress Notes (Signed)
Subjective:   Patient ID: Tiffany Pacheco female   DOB: 06-27-1955 56 y.o.   MRN: [id number]  HPI: [name] is a 56 y.o. with HTN, chronic episodes of CP followed by cardiology, OA, HLD who presents for follow-up.  SHe reports worsening R hand pain for a year.  Pain in on top of right hand and constant.  Worse with a lot of use, especially wrist extension.  She has dropped stuff before.  No history of hand trauma.  Tylenol helps just a little, voltaren gel helps some.    She also has continued L knee pain and back pain that she also uses voltaren gel for.       Past Medical History  Diagnosis Date  . Hypertension   . Arthritis   . Scoliosis   . Chest pain   . Health maintenance examination   . Low back pain   . Healthcare maintenance   . Osteoarthritis   . Other screening mammogram   . Tendonitis, Achilles, right   . Wrist pain   . Hyperlipidemia    Current Outpatient Prescriptions  Medication Sig Dispense Refill  . amitriptyline (ELAVIL) 25 MG tablet Take 1 tablet (25 mg total) by mouth at bedtime.  30 tablet  6  . diclofenac sodium (VOLTAREN) 1 % GEL APPLY ONCE TOPICALLY 4 TIMES DAILY AS NEEDED( APPLY TO KNEE AND /OR BACK and/or Hand UP TO FOUR TIMES PER DAY FOR PAIN)  2 Tube  6  . hydrochlorothiazide 25 MG tablet Take 1 tablet (25 mg total) by mouth daily.  30 tablet  6  . omeprazole (PRILOSEC) 20 MG capsule Take 1 capsule (20 mg total) by mouth daily.  30 capsule  6  . pravastatin (PRAVACHOL) 20 MG tablet Take 1 tablet (20 mg total) by mouth daily.  30 tablet  4  . ranitidine (ZANTAC) 150 MG tablet Take 1 tablet (150 mg total) by mouth 2 (two) times daily as needed for heartburn.  60 tablet  1   Family History  Problem Relation Age of Onset  . Diabetes Mother   . Hypertension Mother   . Cancer Cousin    History   Social History  . Marital Status: Divorced    Spouse Name: N/A    Number of Children: N/A  . Years of Education: N/A   Social History Main Topics   . Smoking status: Former Smoker -- 0.5 packs/day for 2 years    Types: Cigarettes    Quit date: 11/24/1988  . Smokeless tobacco: None  . Alcohol Use: No  . Drug Use: No  . Sexually Active: None   Other Topics Concern  . None   Social History Narrative  . None   Review of Systems: Constitutional: Denies fever, chills, diaphoresis, appetite change and fatigue.  Respiratory: Denies SOB, DOE, cough, chest tightness,  and wheezing.   Cardiovascular: Some continued episodes off occasional chest pain same as those she went to cardiology for evaluation of,  No palpitations and no leg swelling.  Gastrointestinal: Denies nausea, vomiting, abdominal pain, diarrhea, constipation, blood in stool and abdominal distention.  Genitourinary: Denies dysuria, urgency, frequency, hematuria, flank pain and difficulty urinating.  Neurological: Denies dizziness, seizures, syncope, weakness, light-headedness, numbness and headaches.   Objective:  Physical Exam: Filed Vitals:   07/19/11 1519  BP: 133/89  Pulse: 80  Temp: 97.8 F (36.6 C)  TempSrc: Oral  Height: 5\' 2"  (1.575 m)  Weight: 218 lb 12.8 oz (99.247 kg)  Constitutional: Vital signs reviewed.  Patient is a well-developed and well-nourished woman in no acute distress and cooperative with exam. Alert and oriented x3.  Head: Normocephalic and atraumatic Mouth: no erythema or exudates, MMM Eyes: PERRL, EOMI, conjunctivae normal, No scleral icterus.  Neck: No JVD.  Cardiovascular: RRR, S1 normal, S2 normal, no MRG, pulses symmetric and intact bilaterally Pulmonary/Chest: CTAB, no wheezes, rales, or rhonchi Abdominal: Soft. Non-tender, non-distended, bowel sounds are normal, no masses, organomegaly, or guarding present.   Neurological: A&O x3, Strength is normal and symmetric bilaterally, cranial nerve II-XII are grossly intact, no focal motor deficit, sensory intact to light touch bilaterally.  Skin: Warm, dry and intact. No rash, cyanosis,  or clubbing.   MSK: R hand is tender over top of hand.  Most tender areas are between 1st and 2nd metacarpal and over dorsum of wrist.  Tender with resistance of wrist extension.  Assessment & Plan:

## 2011-07-25 NOTE — Assessment & Plan Note (Signed)
BP good today

## 2011-07-25 NOTE — Assessment & Plan Note (Signed)
Changed pepcid to zantac at patient's request since she recently tried zantac and it worked well.  Continue prilosec as well.

## 2011-07-25 NOTE — Assessment & Plan Note (Signed)
Unsure etiology of her wrist pain.  Could be extensor tendonitis of wrist or even something different like a ganglion cyst that is deeper in.  Patient referred back to sport medicine for evaluation and treatment with recommendation that they send her to hand surgeon if they think it would be needed.  Also recommend they evaluate her L knee for possible injection due to increased pain in it recently.    Increased voltaren gel script to 2 tubes per month since she gets a lot of benefit from it for back, wrist, knee.  She could have this increased further in future if need be.

## 2011-07-27 ENCOUNTER — Other Ambulatory Visit: Payer: Self-pay | Admitting: Family Medicine

## 2011-08-02 ENCOUNTER — Ambulatory Visit: Payer: Medicaid Other | Admitting: Family Medicine

## 2011-08-09 ENCOUNTER — Ambulatory Visit: Payer: Medicaid Other | Admitting: Family Medicine

## 2011-08-20 ENCOUNTER — Encounter: Payer: Self-pay | Admitting: Family Medicine

## 2011-08-20 ENCOUNTER — Ambulatory Visit (INDEPENDENT_AMBULATORY_CARE_PROVIDER_SITE_OTHER): Payer: Medicaid Other | Admitting: Family Medicine

## 2011-08-20 DIAGNOSIS — M19049 Primary osteoarthritis, unspecified hand: Secondary | ICD-10-CM

## 2011-08-22 ENCOUNTER — Encounter: Payer: Self-pay | Admitting: Family Medicine

## 2011-08-22 NOTE — Progress Notes (Signed)
  Subjective:    Patient ID: Tiffany Pacheco, female    DOB: 1955-10-22, 56 y.o.   MRN: [id number]  HPI  RIGHT wrist pain Has noted it getting worse over last year. Right hand dominant. Pain is diffuse, worse with picking up objects. No numbness, no erythema or warmth.  Pain is noted mostly on dorsum of hand / wrist---ill defined. PERTINENT  PMH / PSH: . No prior wrist injury or wrist or hand surgery. No hx DM   Review of Systems    Denies erythema or warmth of the wrist or finger / thumb joints. No numbness in hand  or fingers. Objective:   Physical Exam   Vital signs reviewed. GENERAL: Well developed, well nourished, no acute distress WRIST: right: nontender to palpation. Isolated wrist extension and flexion is not painful even actively aganst resistance. No tenderness along teh thumb tendns and none in snuffbox.  HAND: no swelling or erythema. TTP Thumb CMC joint and pain with passive movment here. NEURO/PULSES: Hand and wrist are neurovascularly intact. SKIN: No skin changes, no rash. ULTRASOUND:  RIGHT CMC joint space--almost totally obliterated with arthritic changes. No effusion.     Assessment & Plan:  1. [facility] arthritis---I think this is the major source of her "wrist pain". We discussed options--she opted for continuing the voltaren gel topically and I gave her a universal thumb splint to see if resting the joint would improve her issues, Only other option that might improve her pain is a CSI--she is not at the place where she would need to consider joint replacement as she does not have malrotation or loss of function. RTC PRN.

## 2011-09-01 ENCOUNTER — Encounter: Payer: Self-pay | Admitting: Cardiovascular Disease

## 2011-09-01 ENCOUNTER — Ambulatory Visit (INDEPENDENT_AMBULATORY_CARE_PROVIDER_SITE_OTHER): Payer: Medicaid Other | Admitting: Cardiovascular Disease

## 2011-09-01 VITALS — BP 133/95 | HR 86 | Ht 60.0 in | Wt 215.0 lb

## 2011-09-01 DIAGNOSIS — I10 Essential (primary) hypertension: Secondary | ICD-10-CM

## 2011-09-01 DIAGNOSIS — E785 Hyperlipidemia, unspecified: Secondary | ICD-10-CM

## 2011-09-01 DIAGNOSIS — R079 Chest pain, unspecified: Secondary | ICD-10-CM

## 2011-09-01 NOTE — Progress Notes (Signed)
Patient ID: Tiffany Pacheco, female   DOB: 1956/03/26, 56 y.o.   MRN: [id number]  Tiffany Pacheco is a 56 y.o. with a history of HTN, HLD, scoliosis, OA, and chronic joint/back pain Her back pain is chronic First seen 12/12  Using voltarin gel for multiple orthopedic issues including left knee pain, right wrist pain.  She reports that her R wrist pain/weakness has become worse. She almost dropped a cup a few days ago. She reports some numbness and tingling in the fingers as well as pain the the MCP joints. PT experience was poor and she only went twice  She gave a new history of intermittent chest pain when review of systems was attained by IM l12/12  (she did not bring this up herself). She says she has had this for a while, including two episodes in the past week. She tends to have episodes when she is very stressed. They get better if she sits down and tries to be very calm. The pain is located centrally in her chest, and sometimes is accompanied by R arm pain. She has accompanying sweating as well as shortness of breath. Pain is very atypical, fleeting, intermitant and nonexertional No previous cardiac w/u. Unable to walk on treadmill due to obesity and arthritis  F/U myovue done 05/05/11 reviewed.  Nulcear images were normal with no ischemia or infarct  EF 60%  Did have ECG changes with lexiscan but I dont think this is clinically significant  She has had less chest pain since 12/12  ROS: Denies fever, malais, weight loss, blurry vision, decreased visual acuity, cough, sputum, SOB, hemoptysis, pleuritic pain, palpitaitons, heartburn, abdominal pain, melena, lower extremity edema, claudication, or rash.  All other systems reviewed and negative  General: Affect appropriate Obese black female HEENT: normal Neck supple with no adenopathy JVP normal no bruits no thyromegaly Lungs clear with no wheezing and good diaphragmatic motion Heart:  S1/S2 no murmur, no rub, gallop or click PMI normal Abdomen:  benighn, BS positve, no tenderness, no AAA no bruit.  No HSM or HJR Distal pulses intact with no bruits Trace bilateral  edema Neuro non-focal Skin warm and dry No muscular weakness   Current Outpatient Prescriptions  Medication Sig Dispense Refill  . amitriptyline (ELAVIL) 25 MG tablet Take 1 tablet (25 mg total) by mouth at bedtime.  30 tablet  6  . diclofenac sodium (VOLTAREN) 1 % GEL APPLY ONCE TOPICALLY 4 TIMES DAILY AS NEEDED( APPLY TO KNEE AND /OR BACK and/or Hand UP TO FOUR TIMES PER DAY FOR PAIN)  2 Tube  6  . hydrochlorothiazide (HYDRODIURIL) 25 MG tablet TAKE ONE TABLET BY MOUTH EVERY DAY  31 tablet  3  . omeprazole (PRILOSEC) 20 MG capsule Take 1 capsule (20 mg total) by mouth daily.  30 capsule  6  . pravastatin (PRAVACHOL) 20 MG tablet Take 1 tablet (20 mg total) by mouth daily.  30 tablet  4  . ranitidine (ZANTAC) 150 MG tablet Take 1 tablet (150 mg total) by mouth 2 (two) times daily as needed for heartburn.  60 tablet  1  . DISCONTD: famotidine (PEPCID) 20 MG tablet Take 20 mg by mouth daily.          Allergies  Codeine  Electrocardiogram:  Assessment and Plan

## 2011-09-01 NOTE — Assessment & Plan Note (Signed)
Well controlled.  Continue current medications and low sodium Dash type diet.    

## 2011-09-01 NOTE — Patient Instructions (Signed)
Your physician wants you to follow-up in: YEAR WITH DR NISHAN  You will receive a reminder letter in the mail two months in advance. If you don't receive a letter, please call our office to schedule the follow-up appointment.  Your physician recommends that you continue on your current medications as directed. Please refer to the Current Medication list given to you today. 

## 2011-09-01 NOTE — Assessment & Plan Note (Addendum)
Consider changing to lipitor or crestor Diet still not ideal  F/U primary Lab Results  Component Value Date   LDLCALC 180* 11/27/2010

## 2011-09-01 NOTE — Assessment & Plan Note (Signed)
Atypical less frequent and myovue normal.  Observe

## 2011-09-15 ENCOUNTER — Encounter: Payer: Medicaid Other | Admitting: Internal Medicine

## 2011-09-16 ENCOUNTER — Encounter: Payer: Medicaid Other | Admitting: Internal Medicine

## 2011-11-05 ENCOUNTER — Ambulatory Visit (HOSPITAL_COMMUNITY): Payer: Medicaid Other | Attending: Internal Medicine

## 2011-12-07 ENCOUNTER — Ambulatory Visit (HOSPITAL_COMMUNITY): Payer: Medicaid Other | Attending: Internal Medicine

## 2012-03-14 ENCOUNTER — Telehealth: Payer: Self-pay | Admitting: *Deleted

## 2012-03-14 NOTE — Telephone Encounter (Signed)
appt thurs 11/14 at 1530 for med refills, dr Zada Girt

## 2012-03-16 ENCOUNTER — Ambulatory Visit: Payer: Medicaid Other | Admitting: Internal Medicine

## 2012-03-17 ENCOUNTER — Ambulatory Visit: Payer: Medicaid Other | Admitting: Internal Medicine

## 2012-03-17 ENCOUNTER — Telehealth: Payer: Self-pay | Admitting: *Deleted

## 2012-03-17 NOTE — Telephone Encounter (Signed)
Daughter calls and states transportation went to the wrong address to pick pt up this am, pt rescheduled for Monday am, pharm states pt has not had BP meds filled since 09/2011 and that was for 3 months, pt states she has not used any other pharm and that info matches clinic records. Pt states she does "mix her meds up sometimes"

## 2012-03-20 ENCOUNTER — Encounter: Payer: Self-pay | Admitting: Internal Medicine

## 2012-03-20 ENCOUNTER — Ambulatory Visit (INDEPENDENT_AMBULATORY_CARE_PROVIDER_SITE_OTHER): Payer: Medicaid Other | Admitting: Internal Medicine

## 2012-03-20 VITALS — BP 122/86 | HR 77 | Temp 98.0°F | Ht 62.0 in | Wt 208.7 lb

## 2012-03-20 DIAGNOSIS — M7661 Achilles tendinitis, right leg: Secondary | ICD-10-CM

## 2012-03-20 DIAGNOSIS — Z0000 Encounter for general adult medical examination without abnormal findings: Secondary | ICD-10-CM

## 2012-03-20 DIAGNOSIS — M545 Low back pain: Secondary | ICD-10-CM

## 2012-03-20 DIAGNOSIS — Z23 Encounter for immunization: Secondary | ICD-10-CM

## 2012-03-20 DIAGNOSIS — J309 Allergic rhinitis, unspecified: Secondary | ICD-10-CM | POA: Insufficient documentation

## 2012-03-20 DIAGNOSIS — E785 Hyperlipidemia, unspecified: Secondary | ICD-10-CM

## 2012-03-20 DIAGNOSIS — Z1231 Encounter for screening mammogram for malignant neoplasm of breast: Secondary | ICD-10-CM

## 2012-03-20 DIAGNOSIS — K219 Gastro-esophageal reflux disease without esophagitis: Secondary | ICD-10-CM

## 2012-03-20 DIAGNOSIS — I10 Essential (primary) hypertension: Secondary | ICD-10-CM

## 2012-03-20 MED ORDER — RANITIDINE HCL 150 MG PO TABS: 150.0000 mg | ORAL_TABLET | Freq: Two times a day (BID) | ORAL | Status: DC | PRN

## 2012-03-20 MED ORDER — PRAVASTATIN SODIUM 40 MG PO TABS: 40.0000 mg | ORAL_TABLET | Freq: Every day | ORAL | Status: DC

## 2012-03-20 MED ORDER — DICLOFENAC SODIUM 1 % TD GEL: TRANSDERMAL | Status: DC

## 2012-03-20 MED ORDER — AMITRIPTYLINE HCL 25 MG PO TABS
25.0000 mg | ORAL_TABLET | Freq: Every day | ORAL | Status: DC
Start: 2012-03-20 — End: 2012-12-20

## 2012-03-20 MED ORDER — HYDROCHLOROTHIAZIDE 25 MG PO TABS: 25.0000 mg | ORAL_TABLET | Freq: Every day | ORAL | Status: DC

## 2012-03-20 MED ORDER — LORATADINE 10 MG PO TABS: 10.0000 mg | ORAL_TABLET | Freq: Every day | ORAL | Status: DC

## 2012-03-20 MED ORDER — PRAVASTATIN SODIUM 20 MG PO TABS: 20.0000 mg | ORAL_TABLET | Freq: Every day | ORAL | Status: DC

## 2012-03-20 MED ORDER — AMITRIPTYLINE HCL 25 MG PO TABS: 25.0000 mg | ORAL_TABLET | Freq: Every day | ORAL | Status: DC

## 2012-03-20 NOTE — Assessment & Plan Note (Addendum)
Lipid Panel     Component Value Date/Time   CHOL 241* 11/27/2010 1101   TRIG 113 11/27/2010 1101   HDL 38* 11/27/2010 1101   CHOLHDL 6.3 11/27/2010 1101   VLDL 23 11/27/2010 1101   LDLCALC 180* 11/27/2010 1101   The Framingham risk score for general cardiovascular risk is 9.9% over the next any years. She reports that she has not been taking her pravastatin.  Plan - I have congregated home with loss and encouraged her to continue with diet and other lifestyle changes like exercise. -Will restart pravastatin today at 40 mg once daily.

## 2012-03-20 NOTE — Patient Instructions (Addendum)
Please start taking Pravastatin for high cholesterol  Please continue with Hydrochlorothiazide for treatment of High Blood Pressure  Please take Claritin for treating the allergies which cause you nasal congestion and sneezing  Please be sure to go for you Mammogram and Colonoscopy. You will be contacted for these appointment  We will give you a flu shot today. Please come back in one year.

## 2012-03-20 NOTE — Assessment & Plan Note (Signed)
She has received a flu shot today. Will plan to do a colonoscopy and mammograms. Referrals have been written.

## 2012-03-20 NOTE — Assessment & Plan Note (Signed)
Her blood pressure is well-controlled today.   Plan  - we will continue with HCTZ

## 2012-03-20 NOTE — Progress Notes (Signed)
Patient ID: Tiffany Pacheco, female   DOB: 08-09-1955, 56 y.o.   MRN: [id number]  Subjective:   Patient ID: Tiffany Pacheco female   DOB: [birth year] 56 y.o.   MRN: [id number]  HPI: Tiffany Pacheco is a 56 y.o. woman with past medical history of hypertension, hyperlipidemia, arthritis of the hand, and chronic allergic rhinitis, who presents to the clinic for med refills and routine check.   Her only complain today is nasal congestion, on and off headaches, and feeling of fullness in the ears. She does not report reduced hearing, she denies fevers or chills. She denies history of ear pain, or pain. She reports that she gets sneezing, and nasal congestion, especially in cold weather. The symptoms have been present for very long time, but she has never used regular medications for them. She also reports some on and off cough without sputum production. She denies chest pain. However, she reports some shortness of breath with exertion.  Tiffany Pacheco was recently evaluated by cardiology and she had a normal Myoview scan. She has an upcoming appointment with her cardiologist in one year. She is also closely following up with sports medicine center were managing her degenerative joint disease involving the [facility] [place] of the thumb.  The patient reports that she ran out of her medications for high blood pressure, and she's been using her daughter's regimen. She also reports that she took pravastatin for no one a month and discontinued. She therefore requests for refills of all medications.  Meds ordered this encounter  Medications  . DISCONTD: hydrochlorothiazide (HYDRODIURIL) 25 MG tablet    Sig: Take 1 tablet (25 mg total) by mouth daily.    Dispense:  31 tablet    Refill:  3  . DISCONTD: amitriptyline (ELAVIL) 25 MG tablet    Sig: Take 1 tablet (25 mg total) by mouth at bedtime.    Dispense:  30 tablet    Refill:  6  . DISCONTD: diclofenac sodium (VOLTAREN) 1 % GEL    Sig: APPLY ONCE TOPICALLY 4 TIMES DAILY AS  NEEDED( APPLY TO KNEE AND /OR BACK and/or Hand UP TO FOUR TIMES PER DAY FOR PAIN)    Dispense:  2 Tube    Refill:  6  . DISCONTD: ranitidine (ZANTAC) 150 MG tablet    Sig: Take 1 tablet (150 mg total) by mouth 2 (two) times daily as needed for heartburn.    Dispense:  60 tablet    Refill:  1  . DISCONTD: loratadine (CLARITIN) 10 MG tablet    Sig: Take 1 tablet (10 mg total) by mouth daily.    Dispense:  30 tablet    Refill:  1  . DISCONTD: pravastatin (PRAVACHOL) 20 MG tablet    Sig: Take 1 tablet (20 mg total) by mouth daily.    Dispense:  30 tablet    Refill:  4  . diclofenac sodium (VOLTAREN) 1 % GEL    Sig: APPLY ONCE TOPICALLY 4 TIMES DAILY AS NEEDED( APPLY TO KNEE AND /OR BACK and/or Hand UP TO FOUR TIMES PER DAY FOR PAIN)    Dispense:  2 Tube    Refill:  6  . amitriptyline (ELAVIL) 25 MG tablet    Sig: Take 1 tablet (25 mg total) by mouth at bedtime.    Dispense:  30 tablet    Refill:  6  . loratadine (CLARITIN) 10 MG tablet    Sig: Take 1 tablet (10 mg total) by mouth daily.  Dispense:  30 tablet    Refill:  1  . pravastatin (PRAVACHOL) 40 MG tablet    Sig: Take 1 tablet (40 mg total) by mouth daily.    Dispense:  30 tablet    Refill:  4  . hydrochlorothiazide (HYDRODIURIL) 25 MG tablet    Sig: Take 1 tablet (25 mg total) by mouth daily.    Dispense:  31 tablet    Refill:  3  . ranitidine (ZANTAC) 150 MG tablet    Sig: Take 1 tablet (150 mg total) by mouth 2 (two) times daily as needed for heartburn.    Dispense:  60 tablet    Refill:  1    Past Medical History  Diagnosis Date  . Hypertension   . Arthritis   . Scoliosis   . Chest pain   . Health maintenance examination   . Low back pain   . Healthcare maintenance   . Osteoarthritis   . Other screening mammogram   . Tendonitis, Achilles, right   . Wrist pain   . Hyperlipidemia    Family History  Problem Relation Age of Onset  . Diabetes Mother   . Hypertension Mother   . Cancer Cousin    History     Social History  . Marital Status: Divorced    Spouse Name: N/A    Number of Children: N/A  . Years of Education: N/A   Social History Main Topics  . Smoking status: Former Smoker -- 0.5 packs/day for 2 years    Types: Cigarettes    Quit date: 11/24/1988  . Smokeless tobacco: None  . Alcohol Use: No  . Drug Use: No  . Sexually Active: None   Other Topics Concern  . None   Social History Narrative  . None   Review of Systems: Review of Systems  Constitutional: Positive for weight loss. Negative for fever, chills, malaise/fatigue and diaphoresis.       She reports that the weight loss in intentional.   HENT: Positive for congestion and tinnitus. Negative for hearing loss, ear pain, nosebleeds, sore throat and ear discharge.   Eyes: Negative.   Respiratory: Positive for cough and shortness of breath. Negative for hemoptysis, sputum production, wheezing and stridor.   Cardiovascular: Negative for chest pain, palpitations, orthopnea, claudication, leg swelling and PND.  Genitourinary: Negative.   Musculoskeletal: Positive for joint pain.  Skin: Negative.   Neurological: Positive for dizziness and headaches. Negative for tingling, tremors, sensory change, speech change, focal weakness, seizures, loss of consciousness and weakness.  Psychiatric/Behavioral: Negative.    Filed Weights   03/20/12 0933  Weight: 208 lb 11.2 oz (94.666 kg)    Objective:  Physical Exam: Filed Vitals:   03/20/12 0933  BP: 122/86  Pulse: 77  Temp: 98 F (36.7 C)  TempSrc: Oral  Height: 5\' 2"  (1.575 m)  Weight: 208 lb 11.2 oz (94.666 kg)  SpO2: 97%  Physical Exam  Constitutional: She is oriented to person, place, and time and well-developed, well-nourished, and in no distress. No distress.  HENT:  Head: Normocephalic and atraumatic.  Right Ear: Hearing, tympanic membrane, external ear and ear canal normal.  Left Ear: Hearing, tympanic membrane, external ear and ear canal normal.  Eyes: EOM  are normal. Pupils are equal, round, and reactive to light.  Neck: Normal range of motion. Neck supple.  Cardiovascular: Normal rate, regular rhythm, normal heart sounds and intact distal pulses.  Exam reveals no gallop and no friction rub.   No  murmur heard. Pulmonary/Chest: No respiratory distress. She has no wheezes. She has no rales. She exhibits no tenderness.  Abdominal: Soft. Bowel sounds are normal.  Musculoskeletal: She exhibits no edema and no tenderness.  Neurological: She is alert and oriented to person, place, and time.  Skin: Skin is warm and dry. She is not diaphoretic.    Assessment & Plan:  I have discussed the assessment and plan for the care of Ms. Crayton with Dr. Aundria Rud as documented in the problem based charting.  In brief, her medications, including HCTZ, amitriptyline, ranitidine, and pravastatin (restarted), will be refilled today. I have also started her on loratadine for the treatment of her allergic rhinitis. We will arrange for screening colonoscopy and mammogram. She'll continue to followup with me in 12 months.

## 2012-03-20 NOTE — Assessment & Plan Note (Signed)
Clinical history and physical findings suggest possibility of chronic allergic rhinitis. I have counseled the patient about this diagnosis.   Plan -Will restart loratidine in at 10 mg once daily. For symptomatic relief.

## 2012-03-21 ENCOUNTER — Encounter: Payer: Self-pay | Admitting: *Deleted

## 2012-04-14 ENCOUNTER — Ambulatory Visit (HOSPITAL_COMMUNITY): Admission: RE | Admit: 2012-04-14 | Payer: Medicaid Other | Source: Ambulatory Visit

## 2012-05-11 ENCOUNTER — Encounter: Payer: Medicaid Other | Admitting: Internal Medicine

## 2012-05-15 ENCOUNTER — Ambulatory Visit (HOSPITAL_COMMUNITY): Payer: Medicaid Other | Attending: Internal Medicine

## 2012-06-02 NOTE — Addendum Note (Signed)
Addended by: Neomia Dear on: 06/02/2012 03:03 PM   Modules accepted: Orders

## 2012-08-15 ENCOUNTER — Other Ambulatory Visit: Payer: Self-pay | Admitting: *Deleted

## 2012-08-15 DIAGNOSIS — J309 Allergic rhinitis, unspecified: Secondary | ICD-10-CM

## 2012-08-15 MED ORDER — LORATADINE 10 MG PO TABS: 10.0000 mg | ORAL_TABLET | Freq: Every day | ORAL | Status: DC

## 2012-08-15 NOTE — Telephone Encounter (Signed)
Pt called with c/o sinus drainage.  She had taken loratadine in past but is out of med. Denies any color to drainage and denies fever.  Pt will try allergy med again and if not improved will call for appointment.   Asked to call for fever, increase drainage or other changes.

## 2012-09-20 ENCOUNTER — Ambulatory Visit: Payer: Medicaid Other | Admitting: Internal Medicine

## 2012-09-20 ENCOUNTER — Encounter: Payer: Self-pay | Admitting: Internal Medicine

## 2012-10-12 ENCOUNTER — Encounter: Payer: Self-pay | Admitting: Cardiovascular Disease

## 2012-10-18 ENCOUNTER — Other Ambulatory Visit: Payer: Self-pay | Admitting: *Deleted

## 2012-10-18 DIAGNOSIS — I10 Essential (primary) hypertension: Secondary | ICD-10-CM

## 2012-10-18 MED ORDER — HYDROCHLOROTHIAZIDE 25 MG PO TABS: 25.0000 mg | ORAL_TABLET | Freq: Every day | ORAL | Status: DC

## 2012-11-08 ENCOUNTER — Ambulatory Visit (INDEPENDENT_AMBULATORY_CARE_PROVIDER_SITE_OTHER): Payer: Medicaid Other | Admitting: Internal Medicine

## 2012-11-08 ENCOUNTER — Encounter: Payer: Self-pay | Admitting: Internal Medicine

## 2012-11-08 VITALS — BP 121/89 | HR 76 | Temp 98.3°F | Ht 62.0 in | Wt 212.8 lb

## 2012-11-08 DIAGNOSIS — E785 Hyperlipidemia, unspecified: Secondary | ICD-10-CM

## 2012-11-08 DIAGNOSIS — J309 Allergic rhinitis, unspecified: Secondary | ICD-10-CM

## 2012-11-08 DIAGNOSIS — H1089 Other conjunctivitis: Secondary | ICD-10-CM

## 2012-11-08 DIAGNOSIS — A499 Bacterial infection, unspecified: Secondary | ICD-10-CM

## 2012-11-08 DIAGNOSIS — R5383 Other fatigue: Secondary | ICD-10-CM

## 2012-11-08 DIAGNOSIS — I10 Essential (primary) hypertension: Secondary | ICD-10-CM

## 2012-11-08 DIAGNOSIS — Z0000 Encounter for general adult medical examination without abnormal findings: Secondary | ICD-10-CM

## 2012-11-08 DIAGNOSIS — H109 Unspecified conjunctivitis: Secondary | ICD-10-CM

## 2012-11-08 DIAGNOSIS — R5381 Other malaise: Secondary | ICD-10-CM | POA: Insufficient documentation

## 2012-11-08 LAB — CBC
HCT: 37.1 % (ref 36.0–46.0)
MCH: 28.7 pg (ref 26.0–34.0)
MCHC: 34.2 g/dL (ref 30.0–36.0)
MCV: 83.7 fL (ref 78.0–100.0)
RDW: 14.3 % (ref 11.5–15.5)

## 2012-11-08 MED ORDER — ERYTHROMYCIN 5 MG/GM OP OINT: TOPICAL_OINTMENT | Freq: Four times a day (QID) | OPHTHALMIC | Status: DC

## 2012-11-08 NOTE — Progress Notes (Signed)
Patient ID: Tiffany Pacheco, female   DOB: 1956-03-07, 57 y.o.   MRN: [id number]  I saw this patient personally and took a brief history and examined her eyes.  She is a 57 year old lady who started having itching in her left eye yesterday, and this morning had conjuntival injection, with watery/sometimes thickened discharge, and some stickiness of the eyelids. She does have a sick contact - a child in the family who was having similar illness. She denies any headache, earache, photophobia, fever, chills or visual problems.   On exam, her left eye palpebral as well as bulbar conjunctiva appear injected, more on the sides, pupil is 3-21mm in diameter, normally reactive to light, (both light and consensual light reflex are present), there is no pain on movement of the eye in different directions, watery discharge seen draining from the eye. There are no spots on the cornea. The iris looks normal. The patient's visual acuity is normal for finger counting. There is no head and neck adenopathy palpable to me.   At this time we will treat this with erythromycin ointment as prescribed by Dr. Zada Girt. However, we have instructed the patient that if the eye does not seem to get better in 24 hours, she needs to call an eye doctor, or call us back so we can try to get her an eye doctor, or go to the ER.

## 2012-11-08 NOTE — Patient Instructions (Addendum)
  General Instructions: Please use eye ointment for eye pain  If you see no improvement in 1-2 days, you may need to see an eye doctor  We will do blood check for your loss of energy I will see you in one month.   Treatment Goals:  Goals (1 Years of Data) as of 11/08/12         As of Today 03/20/12 09/01/11     Blood Pressure    . Blood Pressure < 140/90  121/89 122/86 133/95      Progress Toward Treatment Goals:  Treatment Goal 11/08/2012  Blood pressure at goal    Self Care Goals & Plans:  Self Care Goal 11/08/2012  Manage my medications take my medicines as prescribed; bring my medications to every visit       Care Management & Community Referrals:

## 2012-11-10 NOTE — Assessment & Plan Note (Signed)
BP Readings from Last 3 Encounters:  11/08/12 121/89  03/20/12 122/86  09/01/11 133/95    Lab Results  Component Value Date   NA 141 03/29/2011   K 3.8 03/29/2011   CREATININE 0.85 03/29/2011    Assessment: Blood pressure control: controlled Progress toward BP goal:  at goal Comments:   Plan: Medications:  continue current medications Educational resources provided:   Self management tools provided: home blood pressure logbook Other plans: Patient advised to bring her medication on every clinic visit.

## 2012-11-10 NOTE — Progress Notes (Signed)
Patient ID: [name] female   DOB: 1955/12/31 57 y.o.   MRN: [id number]  HPI: [name] is a 57 y.o. with past medical history of hypertension, arthritis, low back pain, dyslipidemia, presents with complaints of left eye pain, and itching for 2 days and request for refills of her medications.  Left eye Pain  Patient describes increased redness of the left eye with excessive tearing. Symptoms started 2 days ago. She remembers a history of a contact with her grandbaby who had some eye problems but these had resolved at the time of her contact with him. She denies trauma to the eye. She denies the eye feeling sticky in the morning or inability to open it fully. She has normal vision in that eye. She does not remember any use of unusual potential irritants in her eyes. The right eye feels normal. No history of fevers, chills, nausea or vomiting. Eye drops which she got from her mother has not been helpful.   Fatigue. Patient describes a 2 month history of feeling less energetic, she describes feeling, like "being run down". She also reports history of poor sleep, but she's not very sure that she has snoring. She has increased daytime sleepiness, but at nigh her sleep is poor. She does not have any heat or cold intolerance. She had no chest pain or shortness of breath. Her appetite has been normal. She has a 23 pound weight loss, which is intentional through dietary changes with low carbs and fats which has been trying over the last several months. She wonders whether he I can start her on a multivitamin.   Past Medical History  Diagnosis Date  . Hypertension   . Arthritis   . Scoliosis   . Chest pain   . Health maintenance examination   . Low back pain   . Healthcare maintenance   . Osteoarthritis   . Other screening mammogram   . Tendonitis, Achilles, right   . Wrist pain   . Hyperlipidemia    Current Outpatient Prescriptions  Medication Sig Dispense Refill  . diclofenac sodium  (VOLTAREN) 1 % GEL APPLY ONCE TOPICALLY 4 TIMES DAILY AS NEEDED( APPLY TO KNEE AND /OR BACK and/or Hand UP TO FOUR TIMES PER DAY FOR PAIN)  2 Tube  6  . hydrochlorothiazide (HYDRODIURIL) 25 MG tablet Take 1 tablet (25 mg total) by mouth daily.  90 tablet  3  . loratadine (CLARITIN) 10 MG tablet Take 1 tablet (10 mg total) by mouth daily.  30 tablet  0  . pravastatin (PRAVACHOL) 40 MG tablet Take 1 tablet (40 mg total) by mouth daily.  30 tablet  4  . ranitidine (ZANTAC) 150 MG tablet Take 1 tablet (150 mg total) by mouth 2 (two) times daily as needed for heartburn.  60 tablet  1  . amitriptyline (ELAVIL) 25 MG tablet Take 1 tablet (25 mg total) by mouth at bedtime.  30 tablet  6  . erythromycin (ROMYCIN) ophthalmic ointment Place into the left eye every 6 (six) hours.  3.5 g  0  . [DISCONTINUED] famotidine (PEPCID) 20 MG tablet Take 20 mg by mouth daily.         No current facility-administered medications for this visit.   Family History  Problem Relation Age of Onset  . Diabetes Mother   . Hypertension Mother   . Cancer Cousin    History   Social History  . Marital Status: Divorced    Spouse Name: N/A  Number of Children: N/A  . Years of Education: N/A   Social History Main Topics  . Smoking status: Former Smoker -- 0.50 packs/day for 2 years    Types: Cigarettes    Quit date: 11/24/1988  . Smokeless tobacco: None  . Alcohol Use: No  . Drug Use: No  . Sexually Active: None   Other Topics Concern  . None   Social History Narrative  . None    Review of Systems: Constitutional: Denies fever, chills, diaphoresis, appetite change. She reports  HEENT: Denies hearing loss, ear pain, congestion, sore throat, rhinorrhea, sneezing,. Respiratory: Denies SOB, DOE, cough, chest tightness, and wheezing.  Cardiovascular: Denies chest pain, palpitations and leg swelling. No PND or orthopnea. Gastrointestinal: Denies nausea, vomiting, abdominal pain, diarrhea, constipation,blood in  stool and abdominal distention.  Psychiatric/Behavioral: Denies suicidal ideation, mood changes. Denies depression symptoms.  Objective:  Physical Exam: Filed Vitals:   11/08/12 1442  BP: 121/89  Pulse: 76  Temp: 98.3 F (36.8 C)  TempSrc: Oral  Height: 5\' 2"  (1.575 m)  Weight: 212 lb 12.8 oz (96.525 kg)  SpO2: 97%   General: Vital signs reviewed. Obese woman, In no acute distress.  Left eye: Redness, involving the conjunctivae were in the sclera. Increased thin copious discharge. The pupil is reactive to light. Visual acuity is preserved and she is able to read words 10 feet away.  Right eye appears normal on exam. Lungs: Clear to auscultation bilaterally  Heart: Regular; no extra sounds or murmurs  Abdomen: Bowel sounds present, soft, nontender; no hepatosplenomegaly  Extremities: No pedal edema Skin: No rash or wounds  Neurologic: Alert and oriented x3.   Assessment & Plan:  I have discussed my assessment and plan  with Dr. Dalphine Handing . Please see details under problem based charting.

## 2012-11-10 NOTE — Assessment & Plan Note (Signed)
Clinical history and physical examination is very suggestive of a bacterial conjunctivitis of the eye. Viral infection is also a possibility. Plan. -Treat with erythromycin ophthalmic ointment. -Patient has been strongly advised that if she develops increased pain or if the symptoms do not resolve within 1- 2 days to see an ophthalmologist for further evaluation and treatment. Patient verbalized understanding as to try this treatment.

## 2012-11-10 NOTE — Assessment & Plan Note (Addendum)
The etiology of her fatigue is not very to be at this point. However, poor sleep, obstructive sleep apnea, depression, and anemia are part of the differentials. Patient clinically does not appear anemic. Dyspnea or muscle weakness are not likely. Current medications including Ranitidine and Amtriptylline can be contributing. I do not suspect an infectious causes.   Plan. -Will a CBC to check for anemia. - obstructive sleep apnea remains an issue will consider a sleep study. - I have counseled her about sleep hygiene.  - I will see her in one months and will consider doing TSH.  -Will further evaluate this problem on subsequent visits

## 2012-11-13 NOTE — Progress Notes (Signed)
Seen patient. Please see my note written at the time of the visit. I agree with Dr Huntley Dec documentation of the visit. Thanks.

## 2012-12-13 ENCOUNTER — Encounter: Payer: Medicaid Other | Admitting: Internal Medicine

## 2012-12-20 ENCOUNTER — Encounter: Payer: Self-pay | Admitting: Internal Medicine

## 2012-12-20 ENCOUNTER — Ambulatory Visit (INDEPENDENT_AMBULATORY_CARE_PROVIDER_SITE_OTHER): Payer: Medicaid Other | Admitting: Internal Medicine

## 2012-12-20 VITALS — BP 110/80 | HR 76 | Temp 97.7°F | Ht 62.0 in | Wt 214.2 lb

## 2012-12-20 DIAGNOSIS — M545 Low back pain: Secondary | ICD-10-CM

## 2012-12-20 DIAGNOSIS — R5381 Other malaise: Secondary | ICD-10-CM

## 2012-12-20 DIAGNOSIS — G479 Sleep disorder, unspecified: Secondary | ICD-10-CM

## 2012-12-20 DIAGNOSIS — I10 Essential (primary) hypertension: Secondary | ICD-10-CM

## 2012-12-20 DIAGNOSIS — Z0000 Encounter for general adult medical examination without abnormal findings: Secondary | ICD-10-CM

## 2012-12-20 MED ORDER — AMITRIPTYLINE HCL 25 MG PO TABS: 12.5000 mg | ORAL_TABLET | Freq: Every day | ORAL | Status: DC

## 2012-12-20 NOTE — Patient Instructions (Signed)
General Instructions:  Please take amitriptyline at 12.5 (half pill) every night I will send you for a sleep study  Please be sure to get your mammogram done  I will see you again in three months   Treatment Goals:  Goals (1 Years of Data) as of 12/20/12         As of Today 11/08/12 03/20/12 09/01/11     Blood Pressure    . Blood Pressure < 140/90  110/80 121/89 122/86 133/95      Progress Toward Treatment Goals:  Treatment Goal 12/20/2012  Blood pressure at goal    Self Care Goals & Plans:  Self Care Goal 12/20/2012  Manage my medications refill my medications on time; bring my medications to every visit; take my medicines as prescribed       Care Management & Community Referrals:

## 2012-12-21 ENCOUNTER — Encounter: Payer: Self-pay | Admitting: Internal Medicine

## 2012-12-21 NOTE — Assessment & Plan Note (Signed)
Gave her FOBT cards She has been reminded about her mammogram, which was ordered several months back.

## 2012-12-21 NOTE — Assessment & Plan Note (Signed)
BP Readings from Last 3 Encounters:  12/20/12 110/80  11/08/12 121/89  03/20/12 122/86    Lab Results  Component Value Date   NA 141 03/29/2011   K 3.8 03/29/2011   CREATININE 0.85 03/29/2011    Assessment: Blood pressure control: controlled Progress toward BP goal:  at goal Comments: none  Plan: Medications:  continue current medications Educational resources provided:   Self management tools provided:   Other plans: will monitor

## 2012-12-21 NOTE — Assessment & Plan Note (Signed)
Her symptoms of have somehow improved. However, I'm still concerned about the possibility of obstructive sleep apnea. I will order a sleep study.

## 2012-12-21 NOTE — Assessment & Plan Note (Signed)
I have encouraged her to reduce the dose of amitriptyline to 12.5 mg each bedtime when necessary as this is likely to help with side effect. We will try again to help her a CPAP machine.

## 2012-12-21 NOTE — Assessment & Plan Note (Signed)
>>  ASSESSMENT AND PLAN FOR PREVENTATIVE HEALTH CARE WRITTEN ON 12/21/2012 11:55 AM BY Dow Adolph, MD  Gave her FOBT cards She has been reminded about her mammogram, which was ordered several months back.

## 2012-12-21 NOTE — Progress Notes (Signed)
Patient ID: Tiffany Pacheco, female   DOB: 01-02-1956, 57 y.o.   MRN: [id number]   Subjective:   HPI: Tiffany Pacheco is a 57 y.o. with past medical history of hypertension, osteoarthritis, possible obstructive sleep apnea, chronic low back pain, hyperlipidemia, presents to the clinic for regular followup visit.   Patient reports on difficulty with sleep for several months. She reports, that she finds it difficult for sleep, and sometimes she wakes up in the night, and unable to fall asleep again. She denies  distractions in her bedroom like excessive noise or light. She however reports she has a TV in her bedroom which does watch a lot.  She reports some response from amitriptyline 25 mg at bedtime as needed. However, this makes her very sleepy during day time. She has not been using it consistently due to this side effect. She denies waking up with shortness of breath or gasping for air. However, she's been told by her son that she snores a lot. Patient also reports daytime sleepiness. She has not had a polysomnography.  Please see the A&P for the status of the pt's chronic medical problems.      Past Medical History  Diagnosis Date  . Hypertension   . Arthritis   . Scoliosis   . Chest pain   . Health maintenance examination   . Low back pain   . Healthcare maintenance   . Osteoarthritis   . Other screening mammogram   . Tendonitis, Achilles, right   . Wrist pain   . Hyperlipidemia    Current Outpatient Prescriptions  Medication Sig Dispense Refill  . amitriptyline (ELAVIL) 25 MG tablet Take 0.5 tablets (12.5 mg total) by mouth at bedtime.  30 tablet  6  . diclofenac sodium (VOLTAREN) 1 % GEL APPLY ONCE TOPICALLY 4 TIMES DAILY AS NEEDED( APPLY TO KNEE AND /OR BACK and/or Hand UP TO FOUR TIMES PER DAY FOR PAIN)  2 Tube  6  . hydrochlorothiazide (HYDRODIURIL) 25 MG tablet Take 1 tablet (25 mg total) by mouth daily.  90 tablet  3  . loratadine (CLARITIN) 10 MG tablet Take 1 tablet  (10 mg total) by mouth daily.  30 tablet  0  . pravastatin (PRAVACHOL) 40 MG tablet Take 1 tablet (40 mg total) by mouth daily.  30 tablet  4  . ranitidine (ZANTAC) 150 MG tablet Take 1 tablet (150 mg total) by mouth 2 (two) times daily as needed for heartburn.  60 tablet  1  . [DISCONTINUED] famotidine (PEPCID) 20 MG tablet Take 20 mg by mouth daily.         No current facility-administered medications for this visit.   Family History  Problem Relation Age of Onset  . Diabetes Mother   . Hypertension Mother   . Cancer Cousin    History   Social History  . Marital Status: Divorced    Spouse Name: N/A    Number of Children: N/A  . Years of Education: N/A   Social History Main Topics  . Smoking status: Former Smoker -- 0.50 packs/day for 2 years    Types: Cigarettes    Quit date: 11/24/1988  . Smokeless tobacco: None  . Alcohol Use: No  . Drug Use: No  . Sexual Activity: None   Other Topics Concern  . None   Social History Narrative  . None   Review of Systems: Constitutional: Denies fever, chills, diaphoresis, appetite change and fatigue.  Respiratory: Denies SOB, DOE, cough, chest tightness,  and wheezing.  Cardiovascular: No chest pain, palpitations and leg swelling.  Gastrointestinal: No abdominal pain, nausea, vomiting, bloody stools Genitourinary: No dysuria, frequency, hematuria, or flank pain.  Musculoskeletal: Chronic back pain.  Objective:  Physical Exam: Filed Vitals:   12/20/12 1440  BP: 110/80  Pulse: 76  Temp: 97.7 F (36.5 C)  TempSrc: Oral  Height: 5\' 2"  (1.575 m)  Weight: 214 lb 3.2 oz (97.16 kg)  SpO2: 98%   General: Obese. No acute distress.  Lungs: CTA bilaterally. Heart: RRR; no extra sounds or murmurs  Abdomen: Non-distended, normal BS, soft, nontender; no hepatosplenomegaly  Extremities: No pedal edema. No joint swelling or tenderness. Neurologic: Alert and oriented x3. No obvious neurologic deficits.  Assessment & Plan:  I have  discussed my assessment and plan  with Dr.Rogers  as detailed under problem based charting.

## 2012-12-22 NOTE — Progress Notes (Signed)
Case discussed with Dr.Kazibwe at the time of the visit.  We reviewed the resident's history and exam and pertinent patient test results.  I agree with the assessment, diagnosis, and plan of care documented in the resident's note.    

## 2013-01-04 ENCOUNTER — Other Ambulatory Visit: Payer: Medicaid Other

## 2013-01-04 DIAGNOSIS — Z1211 Encounter for screening for malignant neoplasm of colon: Secondary | ICD-10-CM

## 2013-01-04 LAB — POC HEMOCCULT BLD/STL (HOME/3-CARD/SCREEN): Fecal Occult Blood, POC: NEGATIVE

## 2013-01-24 ENCOUNTER — Encounter (HOSPITAL_BASED_OUTPATIENT_CLINIC_OR_DEPARTMENT_OTHER): Payer: Medicaid Other

## 2013-02-08 ENCOUNTER — Ambulatory Visit (HOSPITAL_BASED_OUTPATIENT_CLINIC_OR_DEPARTMENT_OTHER): Payer: Medicaid Other | Attending: Internal Medicine

## 2013-03-08 ENCOUNTER — Ambulatory Visit (HOSPITAL_BASED_OUTPATIENT_CLINIC_OR_DEPARTMENT_OTHER): Payer: Medicaid Other | Attending: Internal Medicine

## 2013-03-14 ENCOUNTER — Encounter: Payer: Self-pay | Admitting: Internal Medicine

## 2013-03-14 ENCOUNTER — Encounter: Payer: Medicaid Other | Admitting: Internal Medicine

## 2013-04-04 ENCOUNTER — Ambulatory Visit (INDEPENDENT_AMBULATORY_CARE_PROVIDER_SITE_OTHER): Payer: Medicaid Other | Admitting: Internal Medicine

## 2013-04-04 ENCOUNTER — Encounter: Payer: Self-pay | Admitting: Internal Medicine

## 2013-04-04 VITALS — BP 117/78 | HR 103 | Temp 97.8°F | Ht 62.0 in | Wt 220.4 lb

## 2013-04-04 DIAGNOSIS — Z0000 Encounter for general adult medical examination without abnormal findings: Secondary | ICD-10-CM

## 2013-04-04 DIAGNOSIS — J309 Allergic rhinitis, unspecified: Secondary | ICD-10-CM

## 2013-04-04 DIAGNOSIS — I10 Essential (primary) hypertension: Secondary | ICD-10-CM

## 2013-04-04 DIAGNOSIS — Z6841 Body Mass Index (BMI) 40.0 and over, adult: Secondary | ICD-10-CM | POA: Insufficient documentation

## 2013-04-04 DIAGNOSIS — E785 Hyperlipidemia, unspecified: Secondary | ICD-10-CM

## 2013-04-04 DIAGNOSIS — G479 Sleep disorder, unspecified: Secondary | ICD-10-CM

## 2013-04-04 DIAGNOSIS — E669 Obesity, unspecified: Secondary | ICD-10-CM

## 2013-04-04 DIAGNOSIS — Z23 Encounter for immunization: Secondary | ICD-10-CM

## 2013-04-04 LAB — LIPID PANEL
HDL: 43 mg/dL (ref >39)
LDL Cholesterol: 159 mg/dL — ABNORMAL HIGH (ref 0–99)
Total CHOL/HDL Ratio: 5.2 Ratio
VLDL: 21 mg/dL (ref 0–40)

## 2013-04-04 MED ORDER — LORATADINE 10 MG PO TABS: 10.0000 mg | ORAL_TABLET | Freq: Every day | ORAL | Status: DC

## 2013-04-04 MED ORDER — OMEPRAZOLE 20 MG PO CPDR: 20.0000 mg | DELAYED_RELEASE_CAPSULE | Freq: Every morning | ORAL | Status: DC

## 2013-04-04 MED ORDER — ATORVASTATIN CALCIUM 40 MG PO TABS: 40.0000 mg | ORAL_TABLET | Freq: Every day | ORAL | Status: DC

## 2013-04-04 MED ORDER — HYDROCHLOROTHIAZIDE 25 MG PO TABS: 25.0000 mg | ORAL_TABLET | Freq: Every day | ORAL | Status: DC

## 2013-04-04 NOTE — Patient Instructions (Signed)
General Instructions: Please take your medications as before I have switched your Pravastatin to Atorvastatin today Please be sure to have your mammogram performed We will check you cholesterol level today.  Please follow up with me in 6 months     Treatment Goals:  Goals (1 Years of Data) as of 04/04/13         As of Today 12/20/12 11/08/12 03/20/12     Blood Pressure    . Blood Pressure < 140/90  117/78 110/80 121/89 122/86      Progress Toward Treatment Goals:  Treatment Goal 04/04/2013  Blood pressure at goal    Self Care Goals & Plans:  Self Care Goal 04/04/2013  Manage my medications take my medicines as prescribed; bring my medications to every visit; refill my medications on time; follow the sick day instructions if I am sick    No flowsheet data found.   Care Management & Community Referrals:  Referral 04/04/2013  Referrals made for care management support nutritionist

## 2013-04-04 NOTE — Assessment & Plan Note (Signed)
She has been off her pravastatin for more than a month.  Will Change statin therapy to moderate to high intensity give risk as above in overwiew.   Plan  - lipid panel today - stop pravastatin - start Lipitor at 40 mg daily.

## 2013-04-04 NOTE — Assessment & Plan Note (Signed)
BP Readings from Last 3 Encounters:  04/04/13 117/78  12/20/12 110/80  11/08/12 121/89    Lab Results  Component Value Date   NA 141 03/29/2011   K 3.8 03/29/2011   CREATININE 0.85 03/29/2011    Assessment: Blood pressure control: controlled Progress toward BP goal:  at goal Comments: Compliant with medications  Plan: Medications:   1. Hydrochlorothiazide 25 mg daily. Educational resources provided:   Self management tools provided:   Other plans: None

## 2013-04-04 NOTE — Assessment & Plan Note (Signed)
Reports improvement with insomnia. Currently on 25 mg of amitriptyline at bedtime when necessary. Still awaiting to perform a polysomnogram to rule out obstructive sleep apnea.

## 2013-04-04 NOTE — Assessment & Plan Note (Signed)
Patient reports that she is unable to exercise due to chronic back pain, and bilateral lower extremity pains. I encouraged her to adopt her lifestyle including diet.

## 2013-04-04 NOTE — Assessment & Plan Note (Signed)
>>  ASSESSMENT AND PLAN FOR PREVENTATIVE HEALTH CARE WRITTEN ON 04/04/2013  4:52 PM BY Dow Adolph, MD  Immunisations: Flu shot: given today  Indicated Cancer Screening: Colonoscopy: FOBT test, -12/2012 Ordered for mammogram. Encouraged the patient to get a mammogram done.  Life style changes addressed: Smoking status: nonsmoker

## 2013-04-04 NOTE — Progress Notes (Signed)
Patient ID: Tiffany Pacheco, female   DOB: 10/14/1955, 57 y.o.   MRN: [id number]   Subjective:   HPI: [name] is a 57 y.o. with past medical history of hypertension, osteoarthritis, possible obstructive sleep apnea, chronic low back pain, hyperlipidemia, presents to the clinic for regular followup visit.   Last clinic visit was 12/20/2012. I referred her for a sleep study at that time. She reports that her sleep problems have significantly improved. She continues to take amitriptyline 25 mg at bedtime when necessary. She denies any other specific complaints today except her chronic bilateral knee pains due to osteoarthritis.  Please see the A&P for the status of the pt's chronic medical problems.      Past Medical History  Diagnosis Date  . Hypertension   . Arthritis   . Scoliosis   . Chest pain   . Health maintenance examination   . Low back pain   . Healthcare maintenance   . Osteoarthritis   . Other screening mammogram   . Tendonitis, Achilles, right   . Wrist pain   . Hyperlipidemia    Current Outpatient Prescriptions  Medication Sig Dispense Refill  . amitriptyline (ELAVIL) 25 MG tablet Take 0.5 tablets (12.5 mg total) by mouth at bedtime.  30 tablet  6  . atorvastatin (LIPITOR) 40 MG tablet Take 1 tablet (40 mg total) by mouth daily.  60 tablet  3  . diclofenac sodium (VOLTAREN) 1 % GEL APPLY ONCE TOPICALLY 4 TIMES DAILY AS NEEDED( APPLY TO KNEE AND /OR BACK and/or Hand UP TO FOUR TIMES PER DAY FOR PAIN)  2 Tube  6  . hydrochlorothiazide (HYDRODIURIL) 25 MG tablet Take 1 tablet (25 mg total) by mouth daily.  90 tablet  3  . loratadine (CLARITIN) 10 MG tablet Take 1 tablet (10 mg total) by mouth daily.  30 tablet  0  . omeprazole (PRILOSEC) 20 MG capsule Take 1 capsule (20 mg total) by mouth AC breakfast. Take 30 minutes before breakfast  30 capsule  6  . ranitidine (ZANTAC) 150 MG tablet Take 1 tablet (150 mg total) by mouth 2 (two) times daily as needed for  heartburn.  60 tablet  1  . [DISCONTINUED] famotidine (PEPCID) 20 MG tablet Take 20 mg by mouth daily.         No current facility-administered medications for this visit.   Family History  Problem Relation Age of Onset  . Diabetes Mother   . Hypertension Mother   . Cancer Cousin    History   Social History  . Marital Status: Divorced    Spouse Name: N/A    Number of Children: N/A  . Years of Education: N/A   Social History Main Topics  . Smoking status: Former Smoker -- 0.50 packs/day for 2 years    Types: Cigarettes    Quit date: 11/24/1988  . Smokeless tobacco: None  . Alcohol Use: No  . Drug Use: No  . Sexual Activity: None   Other Topics Concern  . None   Social History Narrative  . None   Review of Systems: Constitutional: Denies fever, chills, diaphoresis, appetite change and fatigue.  Respiratory: Denies SOB, DOE, cough, chest tightness, and wheezing.  Cardiovascular: No chest pain, palpitations and leg swelling.  Gastrointestinal: No abdominal pain, nausea, vomiting, bloody stools Genitourinary: No dysuria, frequency, hematuria, or flank pain.  Musculoskeletal: Chronic back pain.  Objective:  Physical Exam: Filed Vitals:   04/04/13 1433  BP: 117/78  Pulse: 103  Temp: 97.8 F (36.6 C)  TempSrc: Oral  Height: 5\' 2"  (1.575 m)  Weight: 220 lb 6.4 oz (99.973 kg)  SpO2: 98%   General: Obese. No acute distress.  Lungs: CTA bilaterally. Heart: RRR; no extra sounds or murmurs  Abdomen: Non-distended, normal BS, soft, nontender; no hepatosplenomegaly  Extremities: No pedal edema. No joint swelling or tenderness. Neurologic: Alert and oriented x3. No obvious neurologic deficits.  Assessment & Plan:  I have discussed my assessment and plan  with Dr. Josem Kaufmann  as detailed under problem based charting.

## 2013-04-04 NOTE — Assessment & Plan Note (Signed)
Immunisations: Flu shot: given today  Indicated Cancer Screening: Colonoscopy: FOBT test, -12/2012 Ordered for mammogram. Encouraged the patient to get a mammogram done.  Life style changes addressed: Smoking status: nonsmoker

## 2013-04-05 NOTE — Progress Notes (Signed)
Case discussed with Dr. Kazibwe soon after the resident saw the patient.  We reviewed the resident's history and exam and pertinent patient test results.  I agree with the assessment, diagnosis and plan of care documented in the resident's note. 

## 2013-04-12 ENCOUNTER — Other Ambulatory Visit: Payer: Self-pay | Admitting: *Deleted

## 2013-04-12 DIAGNOSIS — M545 Low back pain: Secondary | ICD-10-CM

## 2013-04-13 MED ORDER — DICLOFENAC SODIUM 1 % TD GEL: TRANSDERMAL | Status: DC

## 2013-05-28 ENCOUNTER — Other Ambulatory Visit: Payer: Self-pay | Admitting: *Deleted

## 2013-05-28 DIAGNOSIS — J309 Allergic rhinitis, unspecified: Secondary | ICD-10-CM

## 2013-05-28 MED ORDER — LORATADINE 10 MG PO TABS: 10.0000 mg | ORAL_TABLET | Freq: Every day | ORAL | Status: DC

## 2013-10-03 ENCOUNTER — Encounter: Payer: Medicaid Other | Admitting: Internal Medicine

## 2013-10-17 ENCOUNTER — Encounter: Payer: Self-pay | Admitting: Internal Medicine

## 2013-10-17 ENCOUNTER — Encounter: Payer: Medicaid Other | Admitting: Internal Medicine

## 2013-11-14 ENCOUNTER — Ambulatory Visit (INDEPENDENT_AMBULATORY_CARE_PROVIDER_SITE_OTHER): Payer: Medicaid Other | Admitting: Internal Medicine

## 2013-11-14 ENCOUNTER — Encounter: Payer: Self-pay | Admitting: Internal Medicine

## 2013-11-14 VITALS — BP 123/90 | HR 80 | Temp 98.6°F | Ht 60.0 in | Wt 222.6 lb

## 2013-11-14 DIAGNOSIS — M545 Low back pain, unspecified: Secondary | ICD-10-CM

## 2013-11-14 DIAGNOSIS — I10 Essential (primary) hypertension: Secondary | ICD-10-CM

## 2013-11-14 DIAGNOSIS — M170 Bilateral primary osteoarthritis of knee: Secondary | ICD-10-CM

## 2013-11-14 DIAGNOSIS — Z0000 Encounter for general adult medical examination without abnormal findings: Secondary | ICD-10-CM

## 2013-11-14 DIAGNOSIS — E785 Hyperlipidemia, unspecified: Secondary | ICD-10-CM

## 2013-11-14 MED ORDER — CYCLOBENZAPRINE HCL 5 MG PO TABS: 5.0000 mg | ORAL_TABLET | Freq: Three times a day (TID) | ORAL | Status: DC | PRN

## 2013-11-14 MED ORDER — ACETAMINOPHEN 325 MG PO TABS: 650.0000 mg | ORAL_TABLET | Freq: Four times a day (QID) | ORAL | Status: DC | PRN

## 2013-11-14 NOTE — Patient Instructions (Signed)
General Instructions: Please take Tylenol for back pain  I will order x ray of your back  Please take Flexeril for your back spasms I encourage you to do your mammogram  Please come back in 2 weeks to see how your pain is doing   Treatment Goals:  Goals (1 Years of Data) as of 11/14/13         As of Today 04/04/13 12/20/12 11/08/12     Blood Pressure    . Blood Pressure < 140/90  123/90 117/78 110/80 121/89      Progress Toward Treatment Goals:  Treatment Goal 11/14/2013  Blood pressure at goal    Self Care Goals & Plans:  Self Care Goal 11/14/2013  Manage my medications bring my medications to every visit; refill my medications on time  Eat healthy foods eat more vegetables; eat foods that are low in salt; eat baked foods instead of fried foods  Be physically active find an activity I enjoy    No flowsheet data found.   Care Management & Community Referrals:  Referral 11/14/2013  Referrals made for care management support none needed     Cyclobenzaprine tablets What is this medicine? CYCLOBENZAPRINE (sye kloe BEN za preen) is a muscle relaxer. It is used to treat muscle pain, spasms, and stiffness. This medicine may be used for other purposes; ask your health care provider or pharmacist if you have questions. COMMON BRAND NAME(S): Fexmid, Flexeril What should I tell my health care provider before I take this medicine? They need to know if you have any of these conditions: -heart disease, irregular heartbeat, or previous heart attack -liver disease -thyroid problem -an unusual or allergic reaction to cyclobenzaprine, tricyclic antidepressants, lactose, other medicines, foods, dyes, or preservatives -pregnant or trying to get pregnant -breast-feeding How should I use this medicine? Take this medicine by mouth with a glass of water. Follow the directions on the prescription label. If this medicine upsets your stomach, take it with food or milk. Take your medicine at  regular intervals. Do not take it more often than directed. Talk to your pediatrician regarding the use of this medicine in children. Special care may be needed. Overdosage: If you think you have taken too much of this medicine contact a poison control center or emergency room at once. NOTE: This medicine is only for you. Do not share this medicine with others. What if I miss a dose? If you miss a dose, take it as soon as you can. If it is almost time for your next dose, take only that dose. Do not take double or extra doses. What may interact with this medicine? Do not take this medicine with any of the following medications: -certain medicines for fungal infections like fluconazole, itraconazole, ketoconazole, posaconazole, voriconazole -cisapride -dofetilide -dronedarone -droperidol -flecainide -grepafloxacin -halofantrine -levomethadyl -MAOIs like Carbex, Eldepryl, Marplan, Nardil, and Parnate -nilotinib -pimozide -probucol -sertindole -thioridazine -ziprasidone This medicine may also interact with the following medications: -abarelix -alcohol -certain medicines for cancer -certain medicines for depression, anxiety, or psychotic disturbances -certain medicines for infection like alfuzosin, chloroquine, clarithromycin, levofloxacin, mefloquine, pentamidine, troleandomycin -certain medicines for an irregular heart beat -certain medicines used for sleep or numbness during surgery or procedure -contrast dyes -dolasetron -guanethidine -methadone -octreotide -ondansetron -other medicines that prolong the QT interval (cause an abnormal heart rhythm) -palonosetron -phenothiazines like chlorpromazine, mesoridazine, prochlorperazine, thioridazine -tramadol -vardenafil This list may not describe all possible interactions. Give your health care provider a list of all the medicines, herbs, non-prescription  drugs, or dietary supplements you use. Also tell them if you smoke, drink  alcohol, or use illegal drugs. Some items may interact with your medicine. What should I watch for while using this medicine? Check with your doctor or health care professional if your condition does not improve within 1 to 3 weeks. You may get drowsy or dizzy when you first start taking the medicine or change doses. Do not drive, use machinery, or do anything that may be dangerous until you know how the medicine affects you. Stand or sit up slowly. Your mouth may get dry. Drinking water, chewing sugarless gum, or sucking on hard candy may help. What side effects may I notice from receiving this medicine? Side effects that you should report to your doctor or health care professional as soon as possible: -allergic reactions like skin rash, itching or hives, swelling of the face, lips, or tongue -chest pain -fast heartbeat -hallucinations -seizures -vomiting Side effects that usually do not require medical attention (report to your doctor or health care professional if they continue or are bothersome): -headache This list may not describe all possible side effects. Call your doctor for medical advice about side effects. You may report side effects to FDA at 1-800-FDA-1088. Where should I keep my medicine? Keep out of the reach of children. Store at room temperature between 15 and 30 degrees C (59 and 86 degrees F). Keep container tightly closed. Throw away any unused medicine after the expiration date. NOTE: This sheet is a summary. It may not cover all possible information. If you have questions about this medicine, talk to your doctor, pharmacist, or health care provider.  2015, [employer]. (2012-11-14 12:48:19)

## 2013-11-14 NOTE — Assessment & Plan Note (Signed)
Continue with atorvastatin 40 mg daily

## 2013-11-14 NOTE — Assessment & Plan Note (Signed)
>>  ASSESSMENT AND PLAN FOR PREVENTATIVE HEALTH CARE WRITTEN ON 11/14/2013  5:25 PM BY Dow Adolph, MD  Reminded the patient to go for her screening mammogram, which was ordered during her last visit. Will order FOBT today.

## 2013-11-14 NOTE — Assessment & Plan Note (Signed)
BP Readings from Last 3 Encounters:  11/14/13 123/90  04/04/13 117/78  12/20/12 110/80    Lab Results  Component Value Date   NA 141 03/29/2011   K 3.8 03/29/2011   CREATININE 0.85 03/29/2011    Assessment: Blood pressure control: controlled Progress toward BP goal:  at goal Comments: none  Plan: Medications:  Continue with hydrochlorothiazide 25 mg daily. Educational resources provided: handout Self management tools provided:   Other plans: Follow up in one month

## 2013-11-14 NOTE — Progress Notes (Signed)
Patient ID: Tiffany Pacheco, female   DOB: 1956/03/01, 58 y.o.   MRN: [id number]   Subjective:   HPI: Tiffany Pacheco is a 58 y.o. woman with past medical history of hypertension, osteoarthritis, possible obstructive sleep apnea, chronic low back pain, hyperlipidemia, presents to the clinic for regular followup visit.  She complains of increasing low back pain over the past few months. The pain has made it difficult for her to sleep soundly, and she has problems with mobility as well. She reports some bilateral lower extremity weakness without loss of sphincter control. No recent falls due to weakness. She reports that she has been tried on tramadol, but this was discontinued due to side effects. She is unable to get physical therapy due to insurance issues. She's been trying heating pads and over-the-counter Tylenol without much pain relief. She has never had imaging of the back.  Otherwise, she has no other complaints.   ROS: Constitutional: Denies fever, chills, diaphoresis, appetite change and fatigue.  Respiratory: Denies SOB, DOE, cough, chest tightness, and wheezing. Denies chest pain. CVS: No chest pain, palpitations and leg swelling.  GI: No abdominal pain, nausea, vomiting, bloody stools GU: No dysuria, frequency, hematuria, or flank pain.  MSK: No myalgias. She has joint pains in the knees due to OA. Psych: No depression symptoms. No SI or SA.   Past Medical History  Diagnosis Date  . Hypertension   . Arthritis   . Scoliosis   . Chest pain   . Health maintenance examination   . Low back pain   . Healthcare maintenance   . Osteoarthritis   . Other screening mammogram   . Tendonitis, Achilles, right   . Wrist pain   . Hyperlipidemia    Current Outpatient Prescriptions  Medication Sig Dispense Refill  . amitriptyline (ELAVIL) 25 MG tablet Take 0.5 tablets (12.5 mg total) by mouth at bedtime.  30 tablet  6  . atorvastatin (LIPITOR) 40 MG tablet Take 1 tablet (40 mg  total) by mouth daily.  60 tablet  3  . diclofenac sodium (VOLTAREN) 1 % GEL APPLY ONCE TOPICALLY 4 TIMES DAILY AS NEEDED( APPLY TO KNEE AND /OR BACK and/or Hand UP TO FOUR TIMES PER DAY FOR PAIN)  2 Tube  6  . hydrochlorothiazide (HYDRODIURIL) 25 MG tablet Take 1 tablet (25 mg total) by mouth daily.  90 tablet  3  . loratadine (CLARITIN) 10 MG tablet Take 1 tablet (10 mg total) by mouth daily.  30 tablet  11  . omeprazole (PRILOSEC) 20 MG capsule Take 1 capsule (20 mg total) by mouth AC breakfast. Take 30 minutes before breakfast  30 capsule  6  . acetaminophen (TYLENOL) 325 MG tablet Take 2 tablets (650 mg total) by mouth every 6 (six) hours as needed.  120 tablet  0  . cyclobenzaprine (FLEXERIL) 5 MG tablet Take 1 tablet (5 mg total) by mouth 3 (three) times daily as needed for muscle spasms.  30 tablet  0  . [DISCONTINUED] famotidine (PEPCID) 20 MG tablet Take 20 mg by mouth daily.         No current facility-administered medications for this visit.   Family History  Problem Relation Age of Onset  . Diabetes Mother   . Hypertension Mother   . Cancer Cousin    History   Social History  . Marital Status: Divorced    Spouse Name: N/A    Number of Children: N/A  . Years of Education: N/A  Social History Main Topics  . Smoking status: Former Smoker -- 0.50 packs/day for 2 years    Types: Cigarettes    Quit date: 11/24/1988  . Smokeless tobacco: None  . Alcohol Use: No  . Drug Use: No  . Sexual Activity: None   Other Topics Concern  . None   Social History Narrative  . None    Objective:  Physical Exam: Filed Vitals:   11/14/13 1440  BP: 123/90  Pulse: 80  Temp: 98.6 F (37 C)  TempSrc: Oral  Height: 5' (1.524 m)  Weight: 222 lb 9.6 oz (100.971 kg)  SpO2: 97%   General: Well nourished. No acute distress. Obese woman HEENT: Normal oral mucosa. MMM.  Lungs: CTA bilaterally. Heart: RRR; no extra sounds or murmurs  Abdomen: Non-distended, normal BS, soft,  nontender; no hepatosplenomegaly  Extremities: No pedal edema. Sensation is intact. Straight leg raising sign was positive bilaterally. Back: tenderness in the L-spine level without obvious lesions.  Neurologic: Normal EOM,  Alert and oriented x3. She was able to transfer from chair to the examination couch without help. However, power was reduced in all much groups of LE bilaterally but she had poor effort. Normal strength in UE. Sensation is intact.  No obvious neurologic/cranial nerve deficits.  Assessment & Plan:  I have discussed my assessment and plan  with  my attending in the clinic, Dr. Heide SparkNarendra  as detailed under problem based charting.

## 2013-11-14 NOTE — Assessment & Plan Note (Signed)
Reminded the patient to go for her screening mammogram, which was ordered during her last visit. Will order FOBT today.

## 2013-11-14 NOTE — Assessment & Plan Note (Addendum)
She has ongoing, low back pain, with minimal relief from conservative management and OTC tylenol.  Plan  - I will order x-ray of the lumbar spine and of bilateral hips and pelvis. Some of her pain could be due to OA of the hips.  - I will start her on Tylenol 650 mg every 6 as needed in addition to Flexeril 5 mg 3 times a day as needed.  - Physical therapy would not be an option for her due to insurance issues.  - Reevaluate in 2 weeks and if pain is till uncontrolled we can try NSAID'S or even opiates. She has no red flags.  - Her exam does not indicate neurologic deficits but if the X rays are markedly abnormal or if there are new clinical findings lumber spine MRI will be considered.

## 2013-11-15 NOTE — Progress Notes (Signed)
INTERNAL MEDICINE TEACHING ATTENDING ADDENDUM - Cabot Cromartie, MD: I reviewed and discussed at the time of visit with the resident Dr. Kazibwe, the patient's medical history, physical examination, diagnosis and results of tests and treatment and I agree with the patient's care as documented.    

## 2013-11-16 ENCOUNTER — Ambulatory Visit (HOSPITAL_COMMUNITY)
Admission: RE | Admit: 2013-11-16 | Discharge: 2013-11-16 | Disposition: A | Discharge status: Home / Self Care | Payer: Medicaid Other | Source: Ambulatory Visit | Attending: Internal Medicine | Admitting: Internal Medicine

## 2013-11-16 DIAGNOSIS — M545 Low back pain, unspecified: Secondary | ICD-10-CM | POA: Insufficient documentation

## 2013-11-16 DIAGNOSIS — M25559 Pain in unspecified hip: Secondary | ICD-10-CM | POA: Insufficient documentation

## 2013-11-20 ENCOUNTER — Encounter: Payer: Self-pay | Admitting: Internal Medicine

## 2013-11-28 ENCOUNTER — Encounter: Payer: Medicaid Other | Admitting: Internal Medicine

## 2013-12-07 ENCOUNTER — Emergency Department (HOSPITAL_COMMUNITY)
Admission: EM | Admit: 2013-12-07 | Discharge: 2013-12-07 | Disposition: A | Discharge status: Home / Self Care | Payer: Medicaid Other | Attending: Emergency Medicine | Admitting: Emergency Medicine

## 2013-12-07 DIAGNOSIS — Z87891 Personal history of nicotine dependence: Secondary | ICD-10-CM | POA: Diagnosis not present

## 2013-12-07 DIAGNOSIS — I10 Essential (primary) hypertension: Secondary | ICD-10-CM | POA: Insufficient documentation

## 2013-12-07 DIAGNOSIS — M1990 Unspecified osteoarthritis, unspecified site: Secondary | ICD-10-CM | POA: Insufficient documentation

## 2013-12-07 DIAGNOSIS — Z79899 Other long term (current) drug therapy: Secondary | ICD-10-CM | POA: Insufficient documentation

## 2013-12-07 DIAGNOSIS — T61771A Other fish poisoning, accidental (unintentional), initial encounter: Secondary | ICD-10-CM | POA: Diagnosis not present

## 2013-12-07 DIAGNOSIS — H5789 Other specified disorders of eye and adnexa: Secondary | ICD-10-CM | POA: Diagnosis not present

## 2013-12-07 DIAGNOSIS — Y929 Unspecified place or not applicable: Secondary | ICD-10-CM | POA: Insufficient documentation

## 2013-12-07 DIAGNOSIS — Y9389 Activity, other specified: Secondary | ICD-10-CM | POA: Insufficient documentation

## 2013-12-07 DIAGNOSIS — R6889 Other general symptoms and signs: Secondary | ICD-10-CM | POA: Diagnosis not present

## 2013-12-07 DIAGNOSIS — E785 Hyperlipidemia, unspecified: Secondary | ICD-10-CM | POA: Diagnosis not present

## 2013-12-07 DIAGNOSIS — T7840XA Allergy, unspecified, initial encounter: Secondary | ICD-10-CM

## 2013-12-07 DIAGNOSIS — L299 Pruritus, unspecified: Secondary | ICD-10-CM | POA: Diagnosis present

## 2013-12-07 MED ORDER — DIPHENHYDRAMINE HCL 50 MG/ML IJ SOLN
12.5000 mg | Freq: Once | INTRAMUSCULAR | Status: AC
  Administered 2013-12-07: 12.5 mg via INTRAVENOUS
  Filled 2013-12-07: qty 1

## 2013-12-07 MED ORDER — METHYLPREDNISOLONE SODIUM SUCC 125 MG IJ SOLR
80.0000 mg | Freq: Once | INTRAMUSCULAR | Status: AC
  Administered 2013-12-07: 80 mg via INTRAVENOUS
  Filled 2013-12-07: qty 2

## 2013-12-07 NOTE — Discharge Instructions (Signed)
Take benadryl every 6 hours as need. Follow up with primary care doctor in 1 days time for recheck if symptoms fail to improve/resolve.  Also discuss possible referral to allergist. Return to ER if worse, new symptoms, fevers, trouble breathing, unable to swallow, other concern.  You were given medication in the ER that causes drowsiness - no driving for the next 4 hours.    Food Allergy and Anaphylaxis A food allergy occurs when the body reacts to proteins found in food. In the most common type of food allergy, the immune system creates chemicals usually made to fight germs (antibodies). These chemicals cause problems when the protein is eaten. Problems can also happen when the food is touched or bits of food get into the air (such as while cooking) near someone who is allergic. The problems caused are the allergic symptoms. This type of food allergy must be taken seriously. Even very small amounts of a food can cause a reaction. Severe reactions can be sudden and potentially fatal. This type of food allergy can vary from mild to life threatening (anaphylaxis). It is impossible to predict what the next reaction will be like based on previous reactions.  There can be other problems with food that are not actually allergies. This document only reviews food allergies. CAUSES  It is not known why some people develop food allergy. It may be more common in families with other allergic problems. Any food can cause allergies but the most common ones are:  Peanuts.  Tree nuts (such as almonds, walnuts, hazelnuts, and pecans).  Fish (such as bass, flounder, and cod).  Shellfish (such as crab, lobster, and shrimp).  Milk.  Eggs.  Wheat.  Soybeans. SYMPTOMS  The symptoms of food allergy generally start within minutes to 2 hours after contact with the allergen. The symptoms can remain the same for several hours or get worse. Sometimes the reaction seems to improve only to return (often worse)  later. Common signs/symptoms of a reaction include any or all of the following:  Skin: hives, itching, swelling, flushing.  Eyes: red, watery, itchy, swollen.  Nose: congested, runny, sneezing.  Mouth/throat: swelling of lips, tongue and throat. Itchy throat, hoarseness, choking sensation.  Lungs: Cough, difficulty breathing, wheezing (whistling noise when breathing out).  Gastrointestinal tract: Nausea (feeling sick to one's stomach), vomiting, diarrhea, cramps.  Heart and circulation: dizziness, weakness, fainting, turning pale, fast, slow or irregular pulse.  Nervous system: confusion, fear, sense of doom. Anaphylaxis is the most serious food allergy reaction. It can rapidly cause throat and tongue swelling, difficulty breathing, low blood pressure, collapse and cardiac arrest (heart stops breathing). DIAGNOSIS  The diagnosis of food allergy is made in part on the history of prior reactions. To prove the diagnosis and to find what foods are responsible, your caregiver may suggest:  Allergy skin tests - usually done by allergists in a setting where emergency treatment is available.  Blood tests for allergy.  Food challenges - suspected food is given and the person is observed in a setting where emergency treatment is available.  Food diary - recording foods eaten and reactions.  Elimination diet - suspected food(s) are removed from the diet. TREATMENT  Your caregiver may prescribe medicines to treat an allergic reaction such as:  Epinephrine (also called adrenaline), which comes in a self-injecting device. This is the most important medicine for treating serious food allergies.  Asthma medicine - usually inhaled - to treat breathing problems - in addition to epinephrine.  Antihistamines -  to treat swelling and itching - in addition to epinephrine.  Steroids - given to calm down a serious reaction. Children can outgrow certain food allergies (like milk and eggs). Peanut and  tree nut allergies are less likely to be outgrown. Because of this, sometimes repeat allergy testing is done. HOME CARE INSTRUCTIONS  Avoid contact with the offending food. Strict avoidance is the only way to prevent a reaction.   Keep the food out of the house.  Learn how to read food labels in order to spot the presence of any food you are allergic to. If a packaged food product does not contain an ingredient label, avoid the food product.  If you must have the offending food in the house, discuss how to avoid it with your caregiver. Be especially careful when eating in a restaurant.  Ask the cook or manager (not the waiter) if foods you are allergic to are found in any items on the menu.  Avoid:  [place] foods since oil can keep proteins from previously cooked foods.  Ice cream parlors, Asian restaurants and bakeries - these often use peanut or tree nut ingredients.  Buffets, picnics, school lunches and salad bars because of the risk of contact with foods you are allergic to.  Food prepared in a blender or shared grill.  Request that food be prepared with clean utensils or pans to avoid contamination from prior foods.  Let the staff know you have allergies that can cause serious reactions or death.  If you have a reaction, administer treatment and tell the staff to immediately call for emergency services ( 911 in the [place] If planning travel by air, inform the airline ahead of time (when making the reservation) that you have a food allergy. Wear a medical identification bracelet or necklace (such as one offered by [employer] ) noting your allergy.  If an epinephrine self-injector device is prescribed:  Carry your device everywhere at all times.  Learn how to use the device. Practice by injecting an expired injector into an orange.  Teach all family members, teachers, coaches, etc to use the injector.  Make an injector available at school, work, Catering manager.  Keep a spare injector  in your car.  Educate others about your allergy. This includes school teachers and other school personnel. Tell them what you need to avoid, the symptoms of an allergic reaction, and how others can help during an allergic emergency.  If you experience a subsequent allergic reaction, administer epinephrine promptly and immediately call for emergency services (911 in the [place] SEEK MEDICAL CARE IF:   You have questions about food allergy or its treatments.  Allergy reactions are getting worse or more frequent.  You suspect new food allergies. SEEK IMMEDIATE MEDICAL CARE IF:   You or your child has a serious allergic reaction, even if you have given a shot of epinephrine.  Symptoms return after taking prescribed treatments. MAKE SURE YOU:   Understand these instructions.  Will watch your condition.  Will get help right away if you are not doing well or get worse. Document Released: 01/27/2008 Document Revised: 07/12/2011 Document Reviewed: 03/06/2008 [facility] Patient Information 2015 [place], [place]. This information is not intended to replace advice given to you by your health care provider. Make sure you discuss any questions you have with your health care provider.

## 2013-12-07 NOTE — ED Provider Notes (Signed)
CSN: [id number]     Arrival date & time 12/07/13  0104 History   First MD Initiated Contact with Patient 12/07/13 0247     Chief Complaint  Patient presents with  . Allergic Reaction     (Consider location/radiation/quality/duration/timing/severity/associated sxs/prior Treatment) Patient is a 58 y.o. female presenting with allergic reaction. The history is provided by the patient.  Allergic Reaction Presenting symptoms: no difficulty swallowing, no rash and no wheezing   pt c/o possible allergic rxn after eating fish this evening. States shortly after began to feel itchy, scratchy in throat, and itching/swelling about bil eyes. Denies hx same reaction/symptoms in past. No generalized rash/itching. Pt denies any other recent change in any home or personal products. No recent new meds or change in meds. Denies recent febrile or viral illness. No preceding sore throat, cough or nasal congestion.    Past Medical History  Diagnosis Date  . Hypertension   . Arthritis   . Scoliosis   . Chest pain   . Health maintenance examination   . Low back pain   . Healthcare maintenance   . Osteoarthritis   . Other screening mammogram   . Tendonitis, Achilles, right   . Wrist pain   . Hyperlipidemia    Past Surgical History  Procedure Laterality Date  . Tubal ligation    . Breast lumpectomy     Family History  Problem Relation Age of Onset  . Diabetes Mother   . Hypertension Mother   . Cancer Cousin    History  Substance Use Topics  . Smoking status: Former Smoker -- 0.50 packs/day for 2 years    Types: Cigarettes    Quit date: 11/24/1988  . Smokeless tobacco: Not on file  . Alcohol Use: No   OB History   Grav Para Term Preterm Abortions TAB SAB Ect Mult Living                 Review of Systems  Constitutional: Negative for fever and chills.  HENT: Negative for sore throat and trouble swallowing.   Eyes: Negative for redness.  Respiratory: Negative for shortness of breath and  wheezing.   Cardiovascular: Negative for chest pain.  Gastrointestinal: Negative for vomiting, abdominal pain and diarrhea.  Genitourinary: Negative for dysuria and flank pain.  Musculoskeletal: Negative for back pain and neck pain.  Skin: Negative for rash.  Neurological: Negative for headaches.  Hematological: Does not bruise/bleed easily.  Psychiatric/Behavioral: Negative for confusion.      Allergies  Codeine and Fish-derived products  Home Medications   Prior to Admission medications   Medication Sig Start Date End Date Taking? Authorizing Provider  acetaminophen (TYLENOL) 325 MG tablet Take 2 tablets (650 mg total) by mouth every 6 (six) hours as needed. 11/14/13   Dow Adolph, MD  amitriptyline (ELAVIL) 25 MG tablet Take 0.5 tablets (12.5 mg total) by mouth at bedtime. 12/20/12 12/20/13  Dow Adolph, MD  atorvastatin (LIPITOR) 40 MG tablet Take 1 tablet (40 mg total) by mouth daily. 04/04/13   Dow Adolph, MD  cyclobenzaprine (FLEXERIL) 5 MG tablet Take 1 tablet (5 mg total) by mouth 3 (three) times daily as needed for muscle spasms. 11/14/13   Dow Adolph, MD  diclofenac sodium (VOLTAREN) 1 % GEL APPLY ONCE TOPICALLY 4 TIMES DAILY AS NEEDED( APPLY TO KNEE AND /OR BACK and/or Hand UP TO FOUR TIMES PER DAY FOR PAIN) 04/12/13   Dow Adolph, MD  hydrochlorothiazide (HYDRODIURIL) 25 MG tablet Take 1 tablet (25 mg  total) by mouth daily. 04/04/13   Dow Adolphichard Kazibwe, MD  loratadine (CLARITIN) 10 MG tablet Take 1 tablet (10 mg total) by mouth daily. 05/28/13   Burns SpainElizabeth A Butcher, MD  omeprazole (PRILOSEC) 20 MG capsule Take 1 capsule (20 mg total) by mouth AC breakfast. Take 30 minutes before breakfast 04/04/13 04/04/14  Dow Adolphichard Kazibwe, MD   BP 142/89  Pulse 88  Temp(Src) 98.2 F (36.8 C) (Oral)  Resp 24  Ht 5' (1.524 m)  Wt 219 lb (99.338 kg)  BMI 42.77 kg/m2  SpO2 98% Physical Exam  Nursing note and vitals reviewed. Constitutional: She is oriented to person,  place, and time. She appears well-developed and well-nourished. No distress.  HENT:  Nose: Nose normal.  Mouth/Throat: Oropharynx is clear and moist.  Eyes: Conjunctivae are normal. No scleral icterus.  Mild swelling about eyes. Conj normal.   Neck: Neck supple. No tracheal deviation present.  Cardiovascular: Normal rate, regular rhythm, normal heart sounds and intact distal pulses.   Pulmonary/Chest: Effort normal and breath sounds normal. No respiratory distress. She has no wheezes.  Abdominal: Soft. Normal appearance. She exhibits no distension. There is no tenderness.  Genitourinary:  No cva tenderness  Musculoskeletal: She exhibits no edema and no tenderness.  Neurological: She is alert and oriented to person, place, and time.  Skin: Skin is warm and dry. No rash noted. She is not diaphoretic.  Psychiatric: She has a normal mood and affect.    ED Course  Procedures (including critical care time) Labs Review   MDM   Benadryl iv. Solumedrol iv.  Pt much improved.   No itching, no sob.  Reviewed nursing notes and prior charts for additional history.   Recheck symptoms resolved.   No sob. No wheezing.   Suzi RootsKevin E [place], MD 12/10/13 [phone]

## 2013-12-07 NOTE — ED Notes (Signed)
Patient states that she ate some fish about an hour ago. Afterward she began to have a scratchy throat, eye drainage, and tightening of throat. Patient presents to triage with obviously swollen eyes, raspy voice, and tears from eyes. Endorses no previous reaction to fish in the past.

## 2013-12-19 ENCOUNTER — Encounter: Payer: Medicaid Other | Admitting: Internal Medicine

## 2014-02-06 ENCOUNTER — Encounter: Payer: Medicaid Other | Admitting: Internal Medicine

## 2014-02-13 ENCOUNTER — Encounter: Payer: Self-pay | Admitting: Internal Medicine

## 2014-02-13 ENCOUNTER — Ambulatory Visit (INDEPENDENT_AMBULATORY_CARE_PROVIDER_SITE_OTHER): Payer: Medicaid Other | Admitting: Internal Medicine

## 2014-02-13 ENCOUNTER — Other Ambulatory Visit (HOSPITAL_COMMUNITY)
Admission: RE | Admit: 2014-02-13 | Discharge: 2014-02-13 | Disposition: A | Discharge status: Home / Self Care | Payer: Medicaid Other | Source: Ambulatory Visit | Attending: Internal Medicine | Admitting: Internal Medicine

## 2014-02-13 VITALS — BP 135/95 | HR 67 | Temp 98.4°F | Ht 61.0 in | Wt 219.6 lb

## 2014-02-13 DIAGNOSIS — Z01419 Encounter for gynecological examination (general) (routine) without abnormal findings: Secondary | ICD-10-CM | POA: Insufficient documentation

## 2014-02-13 DIAGNOSIS — M545 Low back pain, unspecified: Secondary | ICD-10-CM

## 2014-02-13 DIAGNOSIS — Z1151 Encounter for screening for human papillomavirus (HPV): Secondary | ICD-10-CM | POA: Diagnosis present

## 2014-02-13 DIAGNOSIS — Z23 Encounter for immunization: Secondary | ICD-10-CM

## 2014-02-13 DIAGNOSIS — I10 Essential (primary) hypertension: Secondary | ICD-10-CM

## 2014-02-13 DIAGNOSIS — Z0000 Encounter for general adult medical examination without abnormal findings: Secondary | ICD-10-CM

## 2014-02-13 MED ORDER — NAPROXEN SODIUM 220 MG PO CAPS: 1.0000 | ORAL_CAPSULE | Freq: Two times a day (BID) | ORAL | Status: DC | PRN

## 2014-02-13 MED ORDER — LISINOPRIL 10 MG PO TABS: 10.0000 mg | ORAL_TABLET | Freq: Every day | ORAL | Status: DC

## 2014-02-13 MED ORDER — CYCLOBENZAPRINE HCL 5 MG PO TABS: 5.0000 mg | ORAL_TABLET | Freq: Three times a day (TID) | ORAL | Status: DC | PRN

## 2014-02-13 NOTE — Assessment & Plan Note (Signed)
Pain is somehow better with Flexeril. She has not tried extra strength Tylenol as I recommended during her last visit. No interim findings. Plan -Continue with Flexeril -Add Aleve 220 mg bid prn  - follow up in 2 weeks.

## 2014-02-13 NOTE — Assessment & Plan Note (Signed)
>>  ASSESSMENT AND PLAN FOR PREVENTATIVE HEALTH CARE WRITTEN ON 02/13/2014 12:32 PM BY Dow Adolph, MD  Immunisations: Flu shot: given today Tdap: up to date    Indicated Cancer Screening: Colonoscopy:  she has not returned FOBT cards, which were given to her during her last visit. She promises to return them. [facility]: [facility] Pap smear: Performed today.

## 2014-02-13 NOTE — Progress Notes (Signed)
Patient ID: Tiffany Pacheco, female   DOB: February 21, 1956, 58 y.o.   MRN: [id number]   Subjective:   HPI: [name] is a 58 y.o. woman with past medical history of hypertension, osteoarthritis, possible obstructive sleep apnea, chronic low back pain, hyperlipidemia, presents to the clinic for regular followup visit.  No new complaints today.    Kindly see the A&P for the status of the pt's chronic medical problems.   ROS: Constitutional: Denies fever, chills, diaphoresis, appetite change and fatigue.  Respiratory: Denies SOB, DOE, cough, chest tightness, and wheezing. Denies chest pain. CVS: No chest pain, palpitations and leg swelling.  GI: No abdominal pain, nausea, vomiting, bloody stools GU: No dysuria, frequency, hematuria, or flank pain.  MSK: No myalgias. She has joint pains in the knees due to OA. Low pain is helped somehow by the flexeril. She has not been using the Tylenol as I recommended during her last visit.  Psych: No depression symptoms. No SI or SA.   Past Medical History  Diagnosis Date  . Hypertension   . Arthritis   . Scoliosis   . Chest pain   . Health maintenance examination   . Low back pain   . Healthcare maintenance   . Osteoarthritis   . Other screening mammogram   . Tendonitis, Achilles, right   . Wrist pain   . Hyperlipidemia    Current Outpatient Prescriptions  Medication Sig Dispense Refill  . atorvastatin (LIPITOR) 40 MG tablet Take 1 tablet (40 mg total) by mouth daily.  60 tablet  3  . cyclobenzaprine (FLEXERIL) 5 MG tablet Take 1 tablet (5 mg total) by mouth 3 (three) times daily as needed for muscle spasms.  30 tablet  0  . diclofenac sodium (VOLTAREN) 1 % GEL Apply 2 g topically 4 (four) times daily.      . hydrochlorothiazide (HYDRODIURIL) 25 MG tablet Take 1 tablet (25 mg total) by mouth daily.  90 tablet  3  . loratadine (CLARITIN) 10 MG tablet Take 1 tablet (10 mg total) by mouth daily.  30 tablet  11  . omeprazole (PRILOSEC) 20 MG  capsule Take 20 mg by mouth daily.      Marland Kitchen. lisinopril (PRINIVIL,ZESTRIL) 10 MG tablet Take 1 tablet (10 mg total) by mouth daily.  30 tablet  1  . Naproxen Sodium 220 MG CAPS Take 1 capsule (220 mg total) by mouth 2 (two) times daily as needed.  60 each  2  . [DISCONTINUED] famotidine (PEPCID) 20 MG tablet Take 20 mg by mouth daily.         No current facility-administered medications for this visit.   Family History  Problem Relation Age of Onset  . Diabetes Mother   . Hypertension Mother   . Cancer Cousin    History   Social History  . Marital Status: Divorced    Spouse Name: N/A    Number of Children: N/A  . Years of Education: N/A   Social History Main Topics  . Smoking status: Former Smoker -- 0.50 packs/day for 2 years    Types: Cigarettes    Quit date: 11/24/1988  . Smokeless tobacco: None  . Alcohol Use: No  . Drug Use: No  . Sexual Activity: None   Other Topics Concern  . None   Social History Narrative  . None    Objective:  Physical Exam: Filed Vitals:   02/13/14 1100 02/13/14 1141  BP: 132/100 135/95  Pulse: 79 67  Temp: 98.4  F (36.9 C)   TempSrc: Oral   Height: 5\' 1"  (1.549 m)   Weight: 219 lb 9.6 oz (99.61 kg)   SpO2: 97%    General: Well nourished. No acute distress. Obese woman HEENT: Normal oral mucosa. MMM.  Lungs: CTA bilaterally. Heart: RRR; no extra sounds or murmurs  Abdomen: Non-distended, normal BS, soft, nontender; no hepatosplenomegaly  Extremities: No pedal edema. Sensation is intact. Back: tenderness in the L-spine level without obvious lesions.  Neurologic: Normal EOM,  Alert and oriented x3. She was able to transfer from chair to the examination couch without help. Reduced power in LE bilaterally but she had poor effort. Normal strength in UE. Sensation is intact.  No obvious neurologic/cranial nerve deficits.  Assessment & Plan:  Discussed case with  my attending in the clinic Dr. Heide SparkNarendra.  See details in problem based  charting.

## 2014-02-13 NOTE — Assessment & Plan Note (Signed)
Immunisations: Flu shot: given today Tdap: up to date    Indicated Cancer Screening: Colonoscopy:  she has not returned FOBT cards, which were given to her during her last visit. She promises to return them. M[facility]ogram: [facility] Pap smear: Performed today.

## 2014-02-13 NOTE — Assessment & Plan Note (Signed)
BP Readings from Last 3 Encounters:  02/13/14 135/95  12/07/13 120/77  11/14/13 123/90    Lab Results  Component Value Date   NA 141 03/29/2011   K 3.8 03/29/2011   CREATININE 0.85 03/29/2011    Assessment: Blood pressure control: mildly elevated Progress toward BP goal:  deteriorated Comments: On HCTZ, 25 mg daily. Reports compliancy.  Plan: Medications:  add Lisinopril 10 mg daily. continue with HCTZ, 25 mg daily. Educational resources provided: handout Self management tools provided:   Other plans: Reevaluate in 2 weeks. Check BMP then.  If blood pressure is well controlled on her next visit, may consider a combination pill of HCTZ and lisinopril.

## 2014-02-13 NOTE — Patient Instructions (Addendum)
General Instructions: Please take 1-2 capsules of aleve for your back pain twice daily as needed. Please start taking Lisinopril 10 mg daily  Please follow up here in 2 weeks  Please bring your medicines with you each time you come to clinic.  Medicines may include prescription medications, over-the-counter medications, herbal remedies, eye drops, vitamins, or other pills.  Progress Toward Treatment Goals:  Treatment Goal 02/13/2014  Blood pressure deteriorated    Self Care Goals & Plans:  Self Care Goal 02/13/2014  Manage my medications take my medicines as prescribed; bring my medications to every visit; refill my medications on time  Eat healthy foods -  Be physically active take a walk every day    No flowsheet data found.   Care Management & Community Referrals:  Referral 11/14/2013  Referrals made for care management support none needed    Lisinopril tablets What is this medicine? LISINOPRIL (lyse [place] pril) is an ACE inhibitor. This medicine is used to treat high blood pressure and heart failure. It is also used to protect the heart immediately after a heart attack. This medicine may be used for other purposes; ask your health care provider or pharmacist if you have questions. COMMON BRAND NAME(S): Prinivil, Zestril What should I tell my health care provider before I take this medicine? They need to know if you have any of these conditions: -diabetes -heart or blood vessel disease -immune system disease like lupus or scleroderma -kidney disease -low blood pressure -previous swelling of the tongue, face, or lips with difficulty breathing, difficulty swallowing, hoarseness, or tightening of the throat -an unusual or allergic reaction to lisinopril, other ACE inhibitors, insect venom, foods, dyes, or preservatives -pregnant or trying to get pregnant -breast-feeding How should I use this medicine? Take this medicine by mouth with a glass of water. Follow the  directions on your prescription label. You may take this medicine with or without food. Take your medicine at regular intervals. Do not stop taking this medicine except on the advice of your doctor or health care professional. Talk to your pediatrician regarding the use of this medicine in children. Special care may be needed. While this drug may be prescribed for children as young as 676 years of age for selected conditions, precautions do apply. Overdosage: If you think you have taken too much of this medicine contact a poison control center or emergency room at once. NOTE: This medicine is only for you. Do not share this medicine with others. What if I miss a dose? If you miss a dose, take it as soon as you can. If it is almost time for your next dose, take only that dose. Do not take double or extra doses. What may interact with this medicine? -diuretics -lithium -NSAIDs, medicines for pain and inflammation, like ibuprofen or naproxen -over-the-counter herbal supplements like hawthorn -potassium salts or potassium supplements -salt substitutes This list may not describe all possible interactions. Give your health care provider a list of all the medicines, herbs, non-prescription drugs, or dietary supplements you use. Also tell them if you smoke, drink alcohol, or use illegal drugs. Some items may interact with your medicine. What should I watch for while using this medicine? Visit your doctor or health care professional for regular check ups. Check your blood pressure as directed. Ask your doctor what your blood pressure should be, and when you should contact him or her. Call your doctor or health care professional if you notice an irregular or fast heart beat.  Women should inform their doctor if they wish to become pregnant or think they might be pregnant. There is a potential for serious side effects to an unborn child. Talk to your health care professional or pharmacist for more  information. Check with your doctor or health care professional if you get an attack of severe diarrhea, nausea and vomiting, or if you sweat a lot. The loss of too much body fluid can make it dangerous for you to take this medicine. You may get drowsy or dizzy. Do not drive, use machinery, or do anything that needs mental alertness until you know how this drug affects you. Do not stand or sit up quickly, especially if you are an older patient. This reduces the risk of dizzy or fainting spells. Alcohol can make you more drowsy and dizzy. Avoid alcoholic drinks. Avoid salt substitutes unless you are told otherwise by your doctor or health care professional. Do not treat yourself for coughs, colds, or pain while you are taking this medicine without asking your doctor or health care professional for advice. Some ingredients may increase your blood pressure. What side effects may I notice from receiving this medicine? Side effects that you should report to your doctor or health care professional as soon as possible: -abdominal pain with or without nausea or vomiting -allergic reactions like skin rash or hives, swelling of the hands, feet, face, lips, throat, or tongue -dark urine -difficulty breathing -dizzy, lightheaded or fainting spell -fever or sore throat -irregular heart beat, chest pain -pain or difficulty passing urine -redness, blistering, peeling or loosening of the skin, including inside the mouth -unusually weak -yellowing of the eyes or skin Side effects that usually do not require medical attention (report to your doctor or health care professional if they continue or are bothersome): -change in taste -cough -decreased sexual function or desire -headache -sun sensitivity -tiredness This list may not describe all possible side effects. Call your doctor for medical advice about side effects. You may report side effects to FDA at 1-800-FDA-1088. Where should I keep my medicine? Keep  out of the reach of children. Store at room temperature between 15 and 30 degrees C (59 and 86 degrees F). Protect from moisture. Keep container tightly closed. Throw away any unused medicine after the expiration date. NOTE: This sheet is a summary. It may not cover all possible information. If you have questions about this medicine, talk to your doctor, pharmacist, or health care provider.  2015, [employer] Standard. (2007-10-23 17:36:32)

## 2014-02-14 LAB — CYTOLOGY - PAP

## 2014-02-15 NOTE — Progress Notes (Signed)
INTERNAL MEDICINE TEACHING ATTENDING ADDENDUM - Tiffany Reid, MD: I reviewed and discussed at the time of visit with the resident Dr. Kazibwe, the patient's medical history, physical examination, diagnosis and results of pertinent tests and treatment and I agree with the patient's care as documented.  

## 2014-02-27 ENCOUNTER — Encounter: Payer: Self-pay | Admitting: Internal Medicine

## 2014-02-27 ENCOUNTER — Other Ambulatory Visit: Payer: Self-pay | Admitting: Internal Medicine

## 2014-02-27 ENCOUNTER — Ambulatory Visit (INDEPENDENT_AMBULATORY_CARE_PROVIDER_SITE_OTHER): Payer: Medicaid Other | Admitting: Internal Medicine

## 2014-02-27 VITALS — BP 133/77 | HR 78 | Temp 98.5°F | Ht 61.0 in | Wt 217.4 lb

## 2014-02-27 DIAGNOSIS — I10 Essential (primary) hypertension: Secondary | ICD-10-CM

## 2014-02-27 DIAGNOSIS — E785 Hyperlipidemia, unspecified: Secondary | ICD-10-CM

## 2014-02-27 DIAGNOSIS — M545 Low back pain, unspecified: Secondary | ICD-10-CM

## 2014-02-27 DIAGNOSIS — Z0000 Encounter for general adult medical examination without abnormal findings: Secondary | ICD-10-CM

## 2014-02-27 DIAGNOSIS — Z1231 Encounter for screening mammogram for malignant neoplasm of breast: Secondary | ICD-10-CM

## 2014-02-27 LAB — BASIC METABOLIC PANEL WITH GFR
BUN: 12 mg/dL (ref 6–23)
CO2: 26 mEq/L (ref 19–32)
Calcium: 9.3 mg/dL (ref 8.4–10.5)
Chloride: 101 mEq/L (ref 96–112)
Creat: 0.87 mg/dL (ref 0.50–1.10)
GFR, EST AFRICAN AMERICAN: 86 mL/min
GFR, Est Non African American: 75 mL/min
GLUCOSE: 100 mg/dL — AB (ref 70–99)
POTASSIUM: 4 meq/L (ref 3.5–5.3)
Sodium: 139 mEq/L (ref 135–145)

## 2014-02-27 MED ORDER — LISINOPRIL-HYDROCHLOROTHIAZIDE 20-12.5 MG PO TABS: 1.0000 | ORAL_TABLET | Freq: Every day | ORAL | Status: DC

## 2014-02-27 MED ORDER — ACETAMINOPHEN 325 MG PO TABS: 325.0000 mg | ORAL_TABLET | Freq: Three times a day (TID) | ORAL | Status: AC | PRN

## 2014-02-27 NOTE — Patient Instructions (Signed)
General Instructions: Please stop taking Lisinopril 10 mg and Hydrochlorthiazide. I have changed to a combination pill Please be sure to make appointment for your mammogram Please come back in 2 weeks  Please bring your medicines with you each time you come to clinic.  Medicines may include prescription medications, over-the-counter medications, herbal remedies, eye drops, vitamins, or other pills.   Progress Toward Treatment Goals:  Treatment Goal 02/27/2014  Blood pressure at goal    Self Care Goals & Plans:  Self Care Goal 02/27/2014  Manage my medications take my medicines as prescribed; bring my medications to every visit; refill my medications on time  Eat healthy foods drink diet soda or water instead of juice or soda; eat more vegetables; eat foods that are low in salt; eat baked foods instead of fried foods; eat fruit for snacks and desserts  Be physically active -    No flowsheet data found.   Care Management & Community Referrals:  Referral 02/27/2014  Referrals made for care management support none needed

## 2014-02-27 NOTE — Assessment & Plan Note (Signed)
Cont with Lipitor.

## 2014-02-27 NOTE — Progress Notes (Signed)
Patient ID: Tiffany Pacheco, female   DOB: [birth year], 58 y.o.   MRN: [id number]   Subjective:   HPI: Tiffany Pacheco is a 58 y.o. woman with past medical history of hypertension, osteoarthritis, possible obstructive sleep apnea, chronic low back pain, hyperlipidemia, presents to the clinic for regular followup visit.  Last office visit was 02/03/2014. I added Lisinopril 10 mg daily to her hydrochlorothiazide 25 mg daily. BP today is well controlled. She has been compliant with her medications.  Her chronic low back pain, has somehow been improved with Flexeril, which also helps her with sleep. She frequently takes a few pills of Aleve per week to relieve her back pain. I will switch her to Tylenol to avoid side effects from NSAIDs.  Kindly see the A&P for the status of the pt's chronic medical problems.   ROS: Constitutional: Denies fever, chills, diaphoresis, appetite change and fatigue.  Respiratory: Denies SOB, DOE, cough, chest tightness, and wheezing. Denies chest pain. CVS: No chest pain, palpitations and leg swelling.  GI: No abdominal pain, nausea, vomiting, bloody stools GU: No dysuria, frequency, hematuria, or flank pain.  MSK: No myalgias. Noted chronic low back pain.  Psych: No depression symptoms. No SI or SA.   Past Medical History  Diagnosis Date  . Hypertension   . Arthritis   . Scoliosis   . Chest pain   . Health maintenance examination   . Low back pain   . Healthcare maintenance   . Osteoarthritis   . Other screening mammogram   . Tendonitis, Achilles, right   . Wrist pain   . Hyperlipidemia    Current Outpatient Prescriptions  Medication Sig Dispense Refill  . acetaminophen (TYLENOL) 325 MG tablet Take 1-2 tablets (325-650 mg total) by mouth every 8 (eight) hours as needed.  60 tablet  2  . atorvastatin (LIPITOR) 40 MG tablet Take 1 tablet (40 mg total) by mouth daily.  60 tablet  3  . cyclobenzaprine (FLEXERIL) 5 MG tablet Take 1 tablet (5 mg total) by  mouth 3 (three) times daily as needed for muscle spasms.  30 tablet  6  . diclofenac sodium (VOLTAREN) 1 % GEL Apply 2 g topically 4 (four) times daily.      Marland Kitchen. lisinopril-hydrochlorothiazide (PRINZIDE,ZESTORETIC) 20-12.5 MG per tablet Take 1 tablet by mouth daily.  30 tablet  1  . loratadine (CLARITIN) 10 MG tablet Take 1 tablet (10 mg total) by mouth daily.  30 tablet  11  . omeprazole (PRILOSEC) 20 MG capsule Take 20 mg by mouth daily.      . [DISCONTINUED] famotidine (PEPCID) 20 MG tablet Take 20 mg by mouth daily.         No current facility-administered medications for this visit.   Family History  Problem Relation Age of Onset  . Diabetes Mother   . Hypertension Mother   . Cancer Cousin    History   Social History  . Marital Status: Divorced    Spouse Name: N/A    Number of Children: N/A  . Years of Education: N/A   Social History Main Topics  . Smoking status: Former Smoker -- 0.50 packs/day for 2 years    Types: Cigarettes    Quit date: 11/24/1988  . Smokeless tobacco: None  . Alcohol Use: No  . Drug Use: No  . Sexual Activity: None   Other Topics Concern  . None   Social History Narrative  . None    Objective:  Physical Exam: Ceasar MonsFiled  Vitals:   02/27/14 1004  BP: 133/77  Pulse: 78  Temp: 98.5 F (36.9 C)  TempSrc: Oral  Height: 5\' 1"  (1.549 m)  Weight: 217 lb 6.4 oz (98.612 kg)  SpO2: 97%   General: Well nourished. No acute distress. Obese woman HEENT: Normal oral mucosa. MMM.  Lungs: CTA bilaterally. Heart: RRR; no extra sounds or murmurs  Abdomen: Non-distended, normal BS, soft, nontender; no hepatosplenomegaly  Extremities: No pedal edema. Sensation is intact.  Neurologic: Normal EOM,  Alert and oriented x3. She was able to transfer from chair to the examination couch without help. Reduced power in LE bilaterally but she had poor effort. Normal strength in UE. Sensation is intact.  No obvious neurologic/cranial nerve deficits.  Assessment & Plan:    Discussed case with  my attending in the clinic Dr. Heide SparkNarendra.  See details in problem based charting.

## 2014-02-27 NOTE — Assessment & Plan Note (Addendum)
BP Readings from Last 3 Encounters:  02/27/14 133/77  02/13/14 135/95  12/07/13 120/77    Lab Results  Component Value Date   NA 141 03/29/2011   K 3.8 03/29/2011   CREATININE 0.85 03/29/2011    Assessment: Blood pressure control: controlled Progress toward BP goal:  at goal Comments: On HCTZ 25 mg and Lisinopril 10 mg daily started on 10/14.  Plan: Medications:  Will simplify regimen to Lisinopril -HCTZ  20-12.5mg  daily in a combination pill. Will discontinue HCTZ and lisinopril separate pills. Educational resources provided: brochure (has information) Self management tools provided:   Other plans: Check BMP today. Followup in 2 weeks since having decreased Lisinopril by 10 mg. If this is a good regimen for her, she will only follow up regularly with me.

## 2014-02-27 NOTE — Assessment & Plan Note (Signed)
>>  ASSESSMENT AND PLAN FOR PREVENTATIVE HEALTH CARE WRITTEN ON 02/27/2014  1:44 PM BY Dow Adolph, MD  Pap smear came back normal. Will give her FOBT cards. Nurse Venita Sheffield assisted to schedule appointment for mammogram.

## 2014-02-27 NOTE — Assessment & Plan Note (Signed)
Pap smear came back normal. Will give her FOBT cards. Nurse Venita SheffieldGladys assisted to schedule appointment for mammogram.

## 2014-02-27 NOTE — Assessment & Plan Note (Signed)
Pain is well-controlled with Aleve and Flexeril. I encouraged the patient instead to take Tylenol in place of aleve to side effects.

## 2014-02-28 NOTE — Progress Notes (Signed)
Case discussed with Dr. Zada GirtKazibwe soon after the resident saw the patient. We reviewed the resident's history and exam and pertinent patient test results. I agree with the assessment, diagnosis, and plan of care documented in the resident's note except that I was the attending he discussed the case with, not Dr. Heide SparkNarendra.

## 2014-03-05 ENCOUNTER — Ambulatory Visit (HOSPITAL_COMMUNITY)
Admission: RE | Admit: 2014-03-05 | Discharge: 2014-03-05 | Disposition: A | Discharge status: Home / Self Care | Payer: Medicaid Other | Source: Ambulatory Visit | Attending: Internal Medicine | Admitting: Internal Medicine

## 2014-03-05 DIAGNOSIS — Z1231 Encounter for screening mammogram for malignant neoplasm of breast: Secondary | ICD-10-CM | POA: Insufficient documentation

## 2014-03-20 ENCOUNTER — Encounter: Payer: Medicaid Other | Admitting: Internal Medicine

## 2014-03-20 ENCOUNTER — Other Ambulatory Visit (INDEPENDENT_AMBULATORY_CARE_PROVIDER_SITE_OTHER): Payer: Medicaid Other

## 2014-03-20 DIAGNOSIS — Z0000 Encounter for general adult medical examination without abnormal findings: Secondary | ICD-10-CM

## 2014-03-21 LAB — POC HEMOCCULT BLD/STL (HOME/3-CARD/SCREEN)
Card #3 Fecal Occult Blood, POC: NEGATIVE
FECAL OCCULT BLD: NEGATIVE
Fecal Occult Blood, POC: NEGATIVE

## 2014-03-27 ENCOUNTER — Encounter: Payer: Medicaid Other | Admitting: Internal Medicine

## 2014-04-29 ENCOUNTER — Ambulatory Visit: Payer: Medicaid Other | Admitting: Internal Medicine

## 2014-04-30 ENCOUNTER — Other Ambulatory Visit: Payer: Self-pay | Admitting: *Deleted

## 2014-04-30 DIAGNOSIS — M545 Low back pain, unspecified: Secondary | ICD-10-CM

## 2014-04-30 MED ORDER — CYCLOBENZAPRINE HCL 5 MG PO TABS: 5.0000 mg | ORAL_TABLET | Freq: Three times a day (TID) | ORAL | Status: DC | PRN

## 2014-04-30 MED ORDER — OMEPRAZOLE 20 MG PO CPDR: 20.0000 mg | DELAYED_RELEASE_CAPSULE | Freq: Every day | ORAL | Status: DC

## 2014-05-13 ENCOUNTER — Encounter: Payer: Self-pay | Admitting: Internal Medicine

## 2014-05-13 ENCOUNTER — Ambulatory Visit: Payer: Medicaid Other | Admitting: Internal Medicine

## 2014-06-12 ENCOUNTER — Encounter: Payer: Medicaid Other | Admitting: Internal Medicine

## 2014-06-20 ENCOUNTER — Ambulatory Visit (INDEPENDENT_AMBULATORY_CARE_PROVIDER_SITE_OTHER): Payer: Medicaid Other | Admitting: Internal Medicine

## 2014-06-20 ENCOUNTER — Encounter: Payer: Self-pay | Admitting: Internal Medicine

## 2014-06-20 VITALS — BP 126/92 | HR 76 | Temp 98.1°F | Ht 61.0 in | Wt 224.0 lb

## 2014-06-20 DIAGNOSIS — I10 Essential (primary) hypertension: Secondary | ICD-10-CM

## 2014-06-20 DIAGNOSIS — J0100 Acute maxillary sinusitis, unspecified: Secondary | ICD-10-CM

## 2014-06-20 DIAGNOSIS — J019 Acute sinusitis, unspecified: Secondary | ICD-10-CM

## 2014-06-20 DIAGNOSIS — J309 Allergic rhinitis, unspecified: Secondary | ICD-10-CM

## 2014-06-20 DIAGNOSIS — J329 Chronic sinusitis, unspecified: Secondary | ICD-10-CM | POA: Insufficient documentation

## 2014-06-20 MED ORDER — AMOXICILLIN-POT CLAVULANATE 875-125 MG PO TABS: 1.0000 | ORAL_TABLET | Freq: Two times a day (BID) | ORAL | Status: AC

## 2014-06-20 NOTE — Assessment & Plan Note (Addendum)
Patient is presenting a two-week history of nasal congestion, rhinorrhea, and cough. She also is reporting associated earache as well as some minimal wheezing at night. She states that she has tried using Nasacort which helps some but her symptoms have still persisted over the last couple weeks. Patient states that her cough is mostly associated with a sensation of postnasal drip. Patient denies any associated sick contacts, fevers, chills. Given the duration of patient's symptoms, her presentation warrants empiric treatment for acute bacterial rhinosinusitis. Exam is overall unremarkable. Bilateral tympanic membranes without erythema or effusion. Patient does not exhibit any risk factors for resistance. -Augmentin (875 mg/125 mg) twice a day for 7 days. -Patient was instructed to return to clinic should her symptoms not improve after this course of antibiotics.

## 2014-06-20 NOTE — Patient Instructions (Signed)
I have prescribed an antibiotic called augmentin for you to take for a week for your cough and nasal drainage and congestion. Please let us know if you do not feel better after a week on this medication. Please continue doing a good job controlling your blood pressure.   General Instructions:   Please bring your medicines with you each time you come to clinic.  Medicines may include prescription medications, over-the-counter medications, herbal remedies, eye drops, vitamins, or other pills.   Progress Toward Treatment Goals:  Treatment Goal 02/27/2014  Blood pressure at goal    Self Care Goals & Plans:  Self Care Goal 06/20/2014  Manage my medications take my medicines as prescribed; refill my medications on time  Monitor my health keep track of my weight  Eat healthy foods eat foods that are low in salt; eat baked foods instead of fried foods  Be physically active find an activity I enjoy; park at the far end of the parking lot    No flowsheet data found.   Care Management & Community Referrals:  Referral 02/27/2014  Referrals made for care management support none needed

## 2014-06-20 NOTE — Progress Notes (Signed)
   Subjective:    Patient ID: Tiffany Pacheco, female    DOB: 1955-05-30, 59 y.o.   MRN: [id number]  HPI  Patient is a 59 year old female with a history of hypertension, sinusitis, hyperlipidemia, osteoarthritis, GERD who presents to clinic for a 2 week history of cough.  Please see problem based charting for more details.  Review of Systems  Constitutional: Negative for fever and chills.  HENT: Positive for rhinorrhea. Negative for sore throat.   Eyes: Negative for visual disturbance.  Respiratory: Positive for cough. Negative for shortness of breath.   Cardiovascular: Negative for chest pain and palpitations.  Gastrointestinal: Negative for nausea, vomiting, abdominal pain, diarrhea, constipation and blood in stool.  Genitourinary: Negative for dysuria and hematuria.  Neurological: Negative for syncope.       Objective:   Physical Exam  Constitutional: She is oriented to person, place, and time. She appears well-developed and well-nourished. No distress.  HENT:  Head: Normocephalic and atraumatic.  Eyes: EOM are normal. Pupils are equal, round, and reactive to light. Left eye exhibits no discharge.  Neck: Normal range of motion. Neck supple. No thyromegaly present.  Cardiovascular: Normal rate and regular rhythm.  Exam reveals no gallop and no friction rub.   No murmur heard. Pulmonary/Chest: Effort normal and breath sounds normal. No respiratory distress. She has no wheezes. She has no rales.  Abdominal: Soft. Bowel sounds are normal. She exhibits no distension. There is no tenderness. There is no rebound.  Musculoskeletal: She exhibits no edema.  Neurological: She is alert and oriented to person, place, and time. No cranial nerve deficit.  Skin: Skin is warm and dry. No rash noted.  Psychiatric: She has a normal mood and affect. Thought content normal.          Assessment & Plan:  Please see problem based charting for more details.

## 2014-06-20 NOTE — Progress Notes (Signed)
Internal Medicine Clinic Attending  Case discussed with Dr. Ngo at the time of the visit.  We reviewed the resident's history and exam and pertinent patient test results.  I agree with the assessment, diagnosis, and plan of care documented in the resident's note. 

## 2014-06-20 NOTE — Assessment & Plan Note (Signed)
BP Readings from Last 3 Encounters:  06/20/14 126/92  02/27/14 133/77  02/13/14 135/95    Lab Results  Component Value Date   NA 139 02/27/2014   K 4.0 02/27/2014   CREATININE 0.87 02/27/2014    Assessment: Blood pressure control: controlled Progress toward BP goal:  at goal Comments: Patient states that she is compliant with her lisinopril-hydrochlorothiazide 20-12 0.5 mg daily. Patient's diastolic pressure right at limit.  Plan: Medications:  continue current medications Educational resources provided: brochure Self management tools provided: home blood pressure logbook Other plans: Consider up titration of her medication regimen should her diastolic pressure remained elevated in the future.

## 2014-07-01 ENCOUNTER — Other Ambulatory Visit: Payer: Self-pay | Admitting: *Deleted

## 2014-07-01 DIAGNOSIS — I10 Essential (primary) hypertension: Secondary | ICD-10-CM

## 2014-07-01 DIAGNOSIS — E785 Hyperlipidemia, unspecified: Secondary | ICD-10-CM

## 2014-07-02 MED ORDER — ATORVASTATIN CALCIUM 40 MG PO TABS: 40.0000 mg | ORAL_TABLET | Freq: Every day | ORAL | Status: DC

## 2014-07-02 MED ORDER — LISINOPRIL-HYDROCHLOROTHIAZIDE 20-12.5 MG PO TABS: 1.0000 | ORAL_TABLET | Freq: Every day | ORAL | Status: DC

## 2014-07-05 ENCOUNTER — Telehealth: Payer: Self-pay | Admitting: *Deleted

## 2014-07-05 NOTE — Telephone Encounter (Signed)
Pharm calls and states lipitor was sent for #60 w/ 3 refills, it is 1 daily, i ask them to do #90 w/ 3, is this ok? Please change the med list

## 2014-07-05 NOTE — Telephone Encounter (Signed)
That is okay with me. Thanks  

## 2014-08-14 ENCOUNTER — Other Ambulatory Visit: Payer: Self-pay | Admitting: Internal Medicine

## 2014-09-16 ENCOUNTER — Encounter: Payer: Self-pay | Admitting: *Deleted

## 2014-09-23 DIAGNOSIS — M1990 Unspecified osteoarthritis, unspecified site: Secondary | ICD-10-CM | POA: Diagnosis not present

## 2014-09-23 DIAGNOSIS — E669 Obesity, unspecified: Secondary | ICD-10-CM | POA: Insufficient documentation

## 2014-09-23 DIAGNOSIS — K088 Other specified disorders of teeth and supporting structures: Secondary | ICD-10-CM | POA: Insufficient documentation

## 2014-09-23 DIAGNOSIS — M7732 Calcaneal spur, left foot: Secondary | ICD-10-CM | POA: Insufficient documentation

## 2014-09-23 DIAGNOSIS — K219 Gastro-esophageal reflux disease without esophagitis: Secondary | ICD-10-CM | POA: Diagnosis not present

## 2014-09-23 DIAGNOSIS — I10 Essential (primary) hypertension: Secondary | ICD-10-CM | POA: Diagnosis not present

## 2014-09-23 DIAGNOSIS — M79672 Pain in left foot: Secondary | ICD-10-CM | POA: Diagnosis present

## 2014-09-23 DIAGNOSIS — Z87891 Personal history of nicotine dependence: Secondary | ICD-10-CM | POA: Insufficient documentation

## 2014-09-23 DIAGNOSIS — E785 Hyperlipidemia, unspecified: Secondary | ICD-10-CM | POA: Diagnosis not present

## 2014-09-23 DIAGNOSIS — M419 Scoliosis, unspecified: Secondary | ICD-10-CM | POA: Insufficient documentation

## 2014-09-23 DIAGNOSIS — Z79899 Other long term (current) drug therapy: Secondary | ICD-10-CM | POA: Insufficient documentation

## 2014-09-24 ENCOUNTER — Encounter (HOSPITAL_COMMUNITY): Payer: Self-pay | Admitting: Emergency Medicine

## 2014-09-24 ENCOUNTER — Emergency Department (HOSPITAL_COMMUNITY)
Admission: EM | Admit: 2014-09-24 | Discharge: 2014-09-24 | Disposition: A | Discharge status: Home / Self Care | Payer: Medicaid Other | Attending: Emergency Medicine | Admitting: Emergency Medicine

## 2014-09-24 ENCOUNTER — Emergency Department (HOSPITAL_COMMUNITY): Payer: Medicaid Other

## 2014-09-24 DIAGNOSIS — K219 Gastro-esophageal reflux disease without esophagitis: Secondary | ICD-10-CM

## 2014-09-24 DIAGNOSIS — K0889 Other specified disorders of teeth and supporting structures: Secondary | ICD-10-CM

## 2014-09-24 DIAGNOSIS — M7732 Calcaneal spur, left foot: Secondary | ICD-10-CM

## 2014-09-24 HISTORY — DX: Obesity, unspecified: E66.9

## 2014-09-24 MED ORDER — FAMOTIDINE 20 MG PO TABS
20.0000 mg | ORAL_TABLET | Freq: Once | ORAL | Status: AC
  Administered 2014-09-24: 20 mg via ORAL
  Filled 2014-09-24: qty 1

## 2014-09-24 MED ORDER — HYDROCODONE-ACETAMINOPHEN 5-325 MG PO TABS
1.0000 | ORAL_TABLET | Freq: Once | ORAL | Status: AC
Start: 2014-09-24 — End: 2014-09-24
  Administered 2014-09-24: 1 via ORAL
  Filled 2014-09-24: qty 1

## 2014-09-24 MED ORDER — HYDROCODONE-ACETAMINOPHEN 5-325 MG PO TABS: ORAL_TABLET | ORAL | Status: DC

## 2014-09-24 MED ORDER — OMEPRAZOLE 20 MG PO CPDR: 20.0000 mg | DELAYED_RELEASE_CAPSULE | Freq: Every day | ORAL | Status: DC

## 2014-09-24 NOTE — ED Notes (Signed)
Pt reports left foot pain and swelling since Saturday, states she rested her foot but it has not gotten any better since then. Denies any injury

## 2014-09-24 NOTE — ED Provider Notes (Signed)
CSN: [id number]     Arrival date & time 09/23/14  2343 History   First MD Initiated Contact with Patient 09/24/14 0100     Chief Complaint  Patient presents with  . Foot Pain     (Consider location/radiation/quality/duration/timing/severity/associated sxs/prior Treatment) HPI    Tiffany Pacheco is a 59 y.o. female complaining of atraumatic left foot pain on the sole of the left foot towards the heel onset 4 days ago. Patient states that pain is 7 out of 10, described as burning and exacerbated by movement and palpation. States weightbearing is very painful and she's and living with a cane now this is not typical for her. States that there is no improvement of the pain throughout the day she ambulates. No prior history of issues with this foot. Patient also reports that she has a sour brash and burning sensation in her throat, she's had this intermittently for several months, the current episode started 2 hours ago. States that she normally takes an antacid but she has been out. Patient denies chest pain, shortness of breath, nausea, vomiting, diaphoresis, cough, fever, chills. She also reports a right lower jaw dental pain intermittently for several months. States that the pain is mild right now but would like a referral to a dentist.  Past Medical History  Diagnosis Date  . Hypertension   . Arthritis   . Scoliosis   . Chest pain   . Health maintenance examination   . Low back pain   . Healthcare maintenance   . Osteoarthritis   . Other screening mammogram   . Tendonitis, Achilles, right   . Wrist pain   . Hyperlipidemia   . Obesity    Past Surgical History  Procedure Laterality Date  . Tubal ligation    . Breast lumpectomy     Family History  Problem Relation Age of Onset  . Diabetes Mother   . Hypertension Mother   . Cancer Cousin    History  Substance Use Topics  . Smoking status: Former Smoker -- 0.50 packs/day for 2 years    Types: Cigarettes    Quit date: 11/24/1988   . Smokeless tobacco: Not on file  . Alcohol Use: No   OB History    No data available     Review of Systems  10 systems reviewed and found to be negative, except as noted in the HPI.   Allergies  Codeine and Fish-derived products  Home Medications   Prior to Admission medications   Medication Sig Start Date End Date Taking? Authorizing Provider  acetaminophen (TYLENOL) 325 MG tablet Take 1-2 tablets (325-650 mg total) by mouth every 8 (eight) hours as needed. 02/27/14   Dow Adolph, MD  atorvastatin (LIPITOR) 40 MG tablet Take 1 tablet (40 mg total) by mouth daily. 07/02/14   Dow Adolph, MD  cyclobenzaprine (FLEXERIL) 5 MG tablet Take 1 tablet (5 mg total) by mouth 3 (three) times daily as needed for muscle spasms. 04/30/14   Dow Adolph, MD  diclofenac sodium (VOLTAREN) 1 % GEL Apply 2 g topically 4 (four) times daily.    Historical Provider, MD  HYDROcodone-acetaminophen (NORCO/VICODIN) 5-325 MG per tablet Take 1-2 tablets by mouth every 6 hours as needed for pain and/or cough. 09/24/14   Gerre Ranum, PA-C  lisinopril-hydrochlorothiazide (PRINZIDE,ZESTORETIC) 20-12.5 MG per tablet Take 1 tablet by mouth daily. 07/02/14   Dow Adolph, MD  loratadine (CLARITIN) 10 MG tablet take 1 tablet by mouth once daily 08/14/14   Dow Adolph,  MD  omeprazole (PRILOSEC) 20 MG capsule Take 1 capsule (20 mg total) by mouth daily. 09/24/14   Dhrithi Riche, PA-C   BP 130/98 mmHg  Pulse 70  Temp(Src) 98.5 F (36.9 C) (Oral)  Resp 16  Ht 5\' 1"  (1.549 m)  Wt 197 lb (89.359 kg)  BMI 37.24 kg/m2  SpO2 92% Physical Exam  Constitutional: She is oriented to person, place, and time. She appears well-developed and well-nourished. No distress.  obese  HENT:  Head: Normocephalic and atraumatic.  Mouth/Throat: Oropharynx is clear and moist.  Generally good dentition, no gingival swelling, erythema or tenderness to palpation. Patient is handling their secretions. There is no  tenderness to palpation or firmness underneath tongue bilaterally. No trismus.    Eyes: Conjunctivae and EOM are normal. Pupils are equal, round, and reactive to light.  Neck: Normal range of motion. Neck supple.  Cardiovascular: Normal rate, regular rhythm and intact distal pulses.   Pulmonary/Chest: Effort normal and breath sounds normal. No respiratory distress. She has no wheezes. She has no rales. She exhibits no tenderness.  Abdominal: Soft. Bowel sounds are normal. There is no tenderness.  Musculoskeletal: Normal range of motion. She exhibits edema.  1+ pitting edema bilaterally to distal shin. Patient is neurovascularly intact, no overlying skin changes on the left foot. Patient is tender to palpation at the left heel. Excellent range of motion to toe and ankle.  Neurological: She is alert and oriented to person, place, and time.  Skin: She is not diaphoretic.  Psychiatric: She has a normal mood and affect.  Nursing note and vitals reviewed.   ED Course  Procedures (including critical care time) Labs Review Labs Reviewed - No data to display  Imaging Review Dg Foot Complete Left  09/24/2014   CLINICAL DATA:  Left plantar foot pain since Saturday. No known injury  EXAM: LEFT FOOT - COMPLETE 3+ VIEW  COMPARISON:  None.  FINDINGS: No acute finding such as fracture, erosion, or soft tissue gas.  Heel spurs ventrally and dorsally without erosion. No focal or advanced joint narrowing. Venous calcifications noted in the lower shin.  IMPRESSION: 1. No acute osseous findings. 2. Heel spurs.   Electronically Signed   By: Marnee SpringJonathon  Watts M.D.   On: 09/24/2014 01:06     EKG Interpretation None      MDM   Final diagnoses:  Heel spur, left  Gastroesophageal reflux disease, esophagitis presence not specified  Pain, dental    Filed Vitals:   09/24/14 0022  BP: 130/98  Pulse: 70  Temp: 98.5 F (36.9 C)  TempSrc: Oral  Resp: 16  Height: 5\' 1"  (1.549 m)  Weight: 197 lb (89.359 kg)   SpO2: 92%    Medications  HYDROcodone-acetaminophen (NORCO/VICODIN) 5-325 MG per tablet 1 tablet (1 tablet Oral Given 09/24/14 0123)  famotidine (PEPCID) tablet 20 mg (20 mg Oral Given 09/24/14 0123)    Tiffany Alstromebra A Laughter is a pleasant 59 y.o. female presenting with multiple complaints including left heel pain (heel spurs seen on x-ray), acid reflux (she is out of her antacids), intermittent dental pain with no acute exacerbation. Patient is very clear that she denies any chest pain or shortness of breath. She states that the episode of reflux is consistent with prior episodes that were alleviated by antacids. We've had an extensive discussion of fall precautions and patient will follow with her primary care physician.  Evaluation does not show pathology that would require ongoing emergent intervention or inpatient treatment. Pt is hemodynamically  stable and mentating appropriately. Discussed findings and plan with patient/guardian, who agrees with care plan. All questions answered. Return precautions discussed and outpatient follow up given.   New Prescriptions   HYDROCODONE-ACETAMINOPHEN (NORCO/VICODIN) 5-325 MG PER TABLET    Take 1-2 tablets by mouth every 6 hours as needed for pain and/or cough.   OMEPRAZOLE (PRILOSEC) 20 MG CAPSULE    Take 1 capsule (20 mg total) by mouth daily.         Wynetta Emery, PA-C 09/24/14 0124  Marisa Severin, MD 09/24/14 [id number]

## 2014-09-24 NOTE — ED Notes (Signed)
Pt. reports left foot pain with swelling onset this week , denies injury , pain increases with walking and movement .

## 2014-09-24 NOTE — Discharge Instructions (Signed)
Take vicodin for breakthrough pain, do not drink alcohol, drive, care for children or do other critical tasks while taking vicodin.  Please be very careful not to fall! The pain medication and crutches puts you at risk for falls. Please rest as much as possible and try to not stay alone.   Please follow with your primary care doctor in the next 2 days for a check-up. They must obtain records for further management.   Do not hesitate to return to the Emergency Department for any new, worsening or concerning symptoms.    Low-cost dental clinic: [facility]**David  Civils  at [phone]**  **Nuala AlphaJanna Civils at [phone] [address]**    You may also call [phone]  Dental Assistance If the dentist on-call cannot see you, please use the resources below:   Patients with Medicaid: [facility] [id number], [phone] [address], [phone]  If unable to pay, or uninsured, contact [facility] [phone]) or [facility] [phone] in [place], [phone] in [facility]) to become qualified for the adult dental clinic  Other Low-Cost Community Dental Services: [facility]- [address], [place], [place], [address]    [phone], Ext. [phone]    2nd and 4th Thursday of the month at 6:30am    10 clients each day by appointment, can sometimes see walk-in     patients if someone does not show for an appointment [facility]- [address], [place], [place], [address]    [phone] [facility]- [address], [place], [place], [address]    [phone]  [facility]- [phone] [facility]- [phone] [facility]- [phone]

## 2014-09-25 ENCOUNTER — Encounter: Payer: Medicaid Other | Admitting: Internal Medicine

## 2014-10-02 ENCOUNTER — Encounter: Payer: Self-pay | Admitting: Internal Medicine

## 2014-10-02 ENCOUNTER — Ambulatory Visit: Payer: Medicaid Other | Admitting: Internal Medicine

## 2015-02-26 ENCOUNTER — Other Ambulatory Visit: Payer: Self-pay | Admitting: Internal Medicine

## 2015-02-26 MED ORDER — OMEPRAZOLE 20 MG PO CPDR: 20.0000 mg | DELAYED_RELEASE_CAPSULE | Freq: Every day | ORAL | Status: DC

## 2015-02-26 NOTE — Telephone Encounter (Signed)
Pt requesting omeprazole to be filled @ [facility].

## 2015-03-06 ENCOUNTER — Other Ambulatory Visit: Payer: Self-pay | Admitting: Internal Medicine

## 2015-03-06 NOTE — Telephone Encounter (Signed)
Omeprazole rx was refilled on 10/2; I called in rx to [facility] pharmacy. Pt called / informed.

## 2015-03-06 NOTE — Telephone Encounter (Signed)
Pt requesting omeprazole to be filled @ [facility] on [address].

## 2015-07-08 ENCOUNTER — Telehealth: Payer: Self-pay | Admitting: Internal Medicine

## 2015-07-08 NOTE — Telephone Encounter (Signed)
APPT. REMINDER CALL, LMTCB °

## 2015-07-09 ENCOUNTER — Encounter: Payer: Self-pay | Admitting: Internal Medicine

## 2015-07-09 ENCOUNTER — Ambulatory Visit (INDEPENDENT_AMBULATORY_CARE_PROVIDER_SITE_OTHER): Payer: Medicaid Other | Admitting: Internal Medicine

## 2015-07-09 VITALS — BP 152/93 | HR 76 | Temp 98.1°F | Ht 60.0 in | Wt 212.0 lb

## 2015-07-09 DIAGNOSIS — M545 Low back pain, unspecified: Secondary | ICD-10-CM

## 2015-07-09 DIAGNOSIS — K219 Gastro-esophageal reflux disease without esophagitis: Secondary | ICD-10-CM

## 2015-07-09 DIAGNOSIS — Z79899 Other long term (current) drug therapy: Secondary | ICD-10-CM

## 2015-07-09 DIAGNOSIS — G8929 Other chronic pain: Secondary | ICD-10-CM

## 2015-07-09 DIAGNOSIS — I10 Essential (primary) hypertension: Secondary | ICD-10-CM | POA: Diagnosis not present

## 2015-07-09 DIAGNOSIS — M419 Scoliosis, unspecified: Secondary | ICD-10-CM

## 2015-07-09 DIAGNOSIS — M170 Bilateral primary osteoarthritis of knee: Secondary | ICD-10-CM | POA: Diagnosis not present

## 2015-07-09 DIAGNOSIS — M47816 Spondylosis without myelopathy or radiculopathy, lumbar region: Secondary | ICD-10-CM

## 2015-07-09 DIAGNOSIS — E785 Hyperlipidemia, unspecified: Secondary | ICD-10-CM

## 2015-07-09 DIAGNOSIS — J309 Allergic rhinitis, unspecified: Secondary | ICD-10-CM | POA: Diagnosis not present

## 2015-07-09 MED ORDER — CYCLOBENZAPRINE HCL 5 MG PO TABS: 5.0000 mg | ORAL_TABLET | Freq: Three times a day (TID) | ORAL | Status: DC | PRN

## 2015-07-09 MED ORDER — DICLOFENAC SODIUM 1 % TD GEL: 2.0000 g | Freq: Four times a day (QID) | TRANSDERMAL | Status: DC

## 2015-07-09 MED ORDER — FLUTICASONE PROPIONATE 50 MCG/ACT NA SUSP: 1.0000 | Freq: Every day | NASAL | Status: DC

## 2015-07-09 MED ORDER — OMEPRAZOLE 20 MG PO CPDR: 20.0000 mg | DELAYED_RELEASE_CAPSULE | Freq: Every day | ORAL | Status: DC

## 2015-07-09 MED ORDER — ATORVASTATIN CALCIUM 40 MG PO TABS: 40.0000 mg | ORAL_TABLET | Freq: Every day | ORAL | Status: DC

## 2015-07-09 NOTE — Assessment & Plan Note (Signed)
Has b/l chronic knee pain from OA R>L. voltaren gel helped, wants refill.   Sent refill for voltaren gel.

## 2015-07-09 NOTE — Progress Notes (Signed)
   Subjective:    Patient ID: Tiffany Pacheco, female    DOB: Feb 12, 1956, 60 y.o.   MRN: [id number]  HPI  60 yo female with hx of HTN, chronic allergic rhinitis, GERD, OA, HLD, chronic low back pain presents for f/up of her chornic problems including HTN, allergic rhinitis, and wants refills.   HTN: on lisinopril-hctz 20-12.5 daily. Has not taken meds today as she just got refills.  Having some runny nose, nasal congestion, some ear pain, and sore throat. Started about 1 week ago. Has hx of allergic rhinitis but not taking anything for it. No fever/chills/n/v/diarrhea/sob or any other symptoms.  Chronic knee pain/back pain: has hx of scoliosis, feeling that flexiril is helping with her back pain. She has chronic knee pain as well L>R 2/2 to OA. Requesting voltaren gel as it helped in the past.   GERD: requesting refill of omeprazole. GERD well controlled on meds.   Review of Systems  Constitutional: Negative for fever, chills and fatigue.  HENT: Positive for congestion, ear pain, postnasal drip, rhinorrhea and sore throat. Negative for trouble swallowing and voice change.   Eyes: Negative.   Respiratory: Negative for cough, chest tightness, shortness of breath and wheezing.   Cardiovascular: Negative for chest pain, palpitations and leg swelling.  Endocrine: Negative.   Genitourinary: Negative.   Musculoskeletal: Positive for back pain and arthralgias. Negative for joint swelling and gait problem.  Skin: Negative.   Allergic/Immunologic: Negative.   Neurological: Negative for dizziness, numbness and headaches.  Hematological: Negative.   Psychiatric/Behavioral: Negative.        Objective:   Physical Exam  Constitutional: She appears well-developed and well-nourished. No distress.  Pleasant female.   HENT:  Head: Normocephalic and atraumatic.  Mouth/Throat: No oropharyngeal exudate.  Sinuses non tender. Normal ear exam.   Eyes: Conjunctivae are normal. Pupils are equal, round,  and reactive to light.  Neck: Normal range of motion.  Cardiovascular: Normal rate and regular rhythm.  Exam reveals no gallop and no friction rub.   No murmur heard. Pulmonary/Chest: Effort normal and breath sounds normal. No respiratory distress. She has no wheezes.  Abdominal: Soft. Bowel sounds are normal. She exhibits no distension. There is no tenderness.  Musculoskeletal: Normal range of motion.  B/l knee bony prominence. No erythema, no warmth. Has mild tenderness to palpation.   Has mild tenderness to palpation on central low back.  Lymphadenopathy:    She has no cervical adenopathy.  Skin: She is not diaphoretic.    Filed Vitals:   07/09/15 0941  BP: 152/93  Pulse: 76  Temp: 98.1 F (36.7 C)         Assessment & Plan:  See problem based a&p

## 2015-07-09 NOTE — Assessment & Plan Note (Signed)
Filed Vitals:   07/09/15 0941  BP: 152/93  Pulse: 76  Temp: 98.1 F (36.7 C)   Has not taken BP meds today as she just picked up her meds. Continue lisinopril-hctz 20-12.5mg  for now. F/up with PCP.

## 2015-07-09 NOTE — Assessment & Plan Note (Signed)
Well controlled on omeprazole. Refilled script.

## 2015-07-09 NOTE — Assessment & Plan Note (Signed)
On lipitor 40mg  daily for HLD. Wants refill. Sent refill.

## 2015-07-09 NOTE — Assessment & Plan Note (Signed)
Chronic low back pain 2/2 to scoliosis and DJD of lumbar spine.  flexiril is helping. Wants refill, sent refill.

## 2015-07-09 NOTE — Assessment & Plan Note (Signed)
Has nasal congestion, sore throat, ear pain. Exam is normal. Likely from allergies.  Asked her to take claritiin or zyrtec or allergra (whichever is cheaper for her) daily. Also gave her flonase.

## 2015-07-09 NOTE — Patient Instructions (Signed)
Use flonase + zyrtec for your allergies. This should help with your nasal congestion.  Use voltaren gel + flexiril as needed for your pain.  Follow up with your PCP Dr. Johnny BridgeSaraiya whenever he has next availability.

## 2015-07-11 NOTE — Addendum Note (Signed)
Addended by: Erlinda HongVINCENT, DUNCAN T on: 07/11/2015 02:09 PM   Modules accepted: Level of Service

## 2015-07-11 NOTE — Progress Notes (Signed)
Internal Medicine Clinic Attending  Case discussed with Dr. Ahmed at the time of the visit.  We reviewed the resident's history and exam and pertinent patient test results.  I agree with the assessment, diagnosis, and plan of care documented in the resident's note. 

## 2015-07-27 ENCOUNTER — Encounter (HOSPITAL_COMMUNITY): Payer: Self-pay

## 2015-07-27 ENCOUNTER — Emergency Department (HOSPITAL_COMMUNITY): Payer: Medicaid Other

## 2015-07-27 ENCOUNTER — Emergency Department (HOSPITAL_COMMUNITY)
Admission: EM | Admit: 2015-07-27 | Discharge: 2015-07-28 | Disposition: A | Discharge status: Home / Self Care | Payer: Medicaid Other | Attending: Emergency Medicine | Admitting: Emergency Medicine

## 2015-07-27 DIAGNOSIS — M419 Scoliosis, unspecified: Secondary | ICD-10-CM | POA: Insufficient documentation

## 2015-07-27 DIAGNOSIS — Z87891 Personal history of nicotine dependence: Secondary | ICD-10-CM | POA: Insufficient documentation

## 2015-07-27 DIAGNOSIS — R000 Tachycardia, unspecified: Secondary | ICD-10-CM | POA: Diagnosis not present

## 2015-07-27 DIAGNOSIS — M1990 Unspecified osteoarthritis, unspecified site: Secondary | ICD-10-CM | POA: Diagnosis not present

## 2015-07-27 DIAGNOSIS — Z7951 Long term (current) use of inhaled steroids: Secondary | ICD-10-CM | POA: Diagnosis not present

## 2015-07-27 DIAGNOSIS — Z79899 Other long term (current) drug therapy: Secondary | ICD-10-CM | POA: Insufficient documentation

## 2015-07-27 DIAGNOSIS — Z791 Long term (current) use of non-steroidal anti-inflammatories (NSAID): Secondary | ICD-10-CM | POA: Diagnosis not present

## 2015-07-27 DIAGNOSIS — E785 Hyperlipidemia, unspecified: Secondary | ICD-10-CM | POA: Diagnosis not present

## 2015-07-27 DIAGNOSIS — E669 Obesity, unspecified: Secondary | ICD-10-CM | POA: Diagnosis not present

## 2015-07-27 DIAGNOSIS — R197 Diarrhea, unspecified: Secondary | ICD-10-CM

## 2015-07-27 DIAGNOSIS — K529 Noninfective gastroenteritis and colitis, unspecified: Secondary | ICD-10-CM | POA: Insufficient documentation

## 2015-07-27 DIAGNOSIS — I10 Essential (primary) hypertension: Secondary | ICD-10-CM | POA: Insufficient documentation

## 2015-07-27 DIAGNOSIS — R112 Nausea with vomiting, unspecified: Secondary | ICD-10-CM

## 2015-07-27 LAB — COMPREHENSIVE METABOLIC PANEL
ALK PHOS: 94 U/L (ref 38–126)
ALT: 22 U/L (ref 14–54)
AST: 25 U/L (ref 15–41)
Albumin: 3.4 g/dL — ABNORMAL LOW (ref 3.5–5.0)
Anion gap: 9 (ref 5–15)
BILIRUBIN TOTAL: 0.9 mg/dL (ref 0.3–1.2)
BUN: 15 mg/dL (ref 6–20)
CALCIUM: 9 mg/dL (ref 8.9–10.3)
CO2: 26 mmol/L (ref 22–32)
Chloride: 107 mmol/L (ref 101–111)
Creatinine, Ser: 1.03 mg/dL — ABNORMAL HIGH (ref 0.44–1.00)
GFR calc Af Amer: >60 mL/min (ref >60)
GFR calc non Af Amer: 59 mL/min — ABNORMAL LOW (ref >60)
GLUCOSE: 119 mg/dL — AB (ref 65–99)
Potassium: 4.1 mmol/L (ref 3.5–5.1)
Sodium: 142 mmol/L (ref 135–145)
TOTAL PROTEIN: 7.1 g/dL (ref 6.5–8.1)

## 2015-07-27 LAB — LIPASE, BLOOD: Lipase: 20 U/L (ref 11–51)

## 2015-07-27 MED ORDER — ONDANSETRON HCL 4 MG/2ML IJ SOLN
4.0000 mg | Freq: Once | INTRAMUSCULAR | Status: AC
  Administered 2015-07-27: 4 mg via INTRAVENOUS
  Filled 2015-07-27: qty 2

## 2015-07-27 MED ORDER — SODIUM CHLORIDE 0.9 % IV BOLUS (SEPSIS)
1000.0000 mL | Freq: Once | INTRAVENOUS | Status: AC
  Administered 2015-07-27: 1000 mL via INTRAVENOUS

## 2015-07-27 NOTE — ED Notes (Signed)
Onset 2pm today vomiting x 2, diarrhea x 2, watery, brown.  No other s/s noted.

## 2015-07-27 NOTE — ED Provider Notes (Signed)
He will carcinoma so CSN: [id number]     Arrival date & time 07/27/15  1902 History   First MD Initiated Contact with Patient 07/27/15 2148     Chief Complaint  Patient presents with  . Vomiting  . Diarrhea      HPI  60 y.o. female with history of hypertension, hyperlipidemia, obesity, osteoarthritis, who presents with 2 episodes of vomiting and diarrhea that began at 11 AM this morning. Patient reports that she was at church when she began to experience nausea. She became lightheaded upon standing then had a feeling of her vision closing in. She sat down and did not syncopize, but had an episode of nonbloody nonbilious vomiting. There was this was followed by 2 episodes of watery brown diarrhea. Denies recent antibiotic use. Reports some abdominal cramping just prior to the diarrhea but no further abdominal pain. Denies chest pain or shortness of breath. She remains nauseous and has sensation of generalized weakness. She's not had anything to eat or drink today for fear of vomiting. Denies fevers. No myalgias, sore throat. Does endorse a dry cough throughout the day today. She does endorse eating leftover plate of food at 11 PM last night that may not have been reheated properly.   Past Medical History  Diagnosis Date  . Hypertension   . Arthritis   . Scoliosis   . Chest pain   . Health maintenance examination   . Low back pain   . Healthcare maintenance   . Osteoarthritis   . Other screening mammogram   . Tendonitis, Achilles, right   . Wrist pain   . Hyperlipidemia   . Obesity    Past Surgical History  Procedure Laterality Date  . Tubal ligation    . Breast lumpectomy     Family History  Problem Relation Age of Onset  . Diabetes Mother   . Hypertension Mother   . Cancer Cousin    Social History  Substance Use Topics  . Smoking status: Former Smoker -- 0.50 packs/day for 2 years    Types: Cigarettes    Quit date: 11/24/1988  . Smokeless tobacco: None  . Alcohol Use:  No   OB History    No data available     Review of Systems  Constitutional: Positive for chills. Negative for fever, activity change and appetite change.  HENT: Negative for congestion, rhinorrhea and sore throat.   Eyes: Negative for visual disturbance.  Respiratory: Positive for cough. Negative for shortness of breath and wheezing.   Cardiovascular: Negative for chest pain and palpitations.  Gastrointestinal: Positive for nausea, vomiting and diarrhea. Negative for abdominal pain, constipation, blood in stool and abdominal distention.  Genitourinary: Negative for dysuria, frequency and flank pain.  Musculoskeletal: Negative for myalgias, back pain, joint swelling, arthralgias, gait problem, neck pain and neck stiffness.  Skin: Negative for rash.  Neurological: Positive for weakness (generalized). Negative for dizziness, tremors, syncope, facial asymmetry, speech difficulty, light-headedness, numbness and headaches.  Psychiatric/Behavioral: Negative for behavioral problems, confusion and agitation.      Allergies  Codeine and Fish-derived products  Home Medications   Prior to Admission medications   Medication Sig Start Date End Date Taking? Authorizing Provider  acetaminophen (TYLENOL) 325 MG tablet Take 1-2 tablets (325-650 mg total) by mouth every 8 (eight) hours as needed. 02/27/14  Yes Dow Adolph, MD  atorvastatin (LIPITOR) 40 MG tablet Take 1 tablet (40 mg total) by mouth daily. 07/09/15  Yes Tasrif Ahmed, MD  cyclobenzaprine (FLEXERIL) 5 MG  tablet Take 1 tablet (5 mg total) by mouth 3 (three) times daily as needed for muscle spasms. 07/09/15  Yes Tasrif Ahmed, MD  diclofenac sodium (VOLTAREN) 1 % GEL Apply 2 g topically 4 (four) times daily. 07/09/15  Yes Tasrif Ahmed, MD  fluticasone (FLONASE) 50 MCG/ACT nasal spray Place 1 spray into both nostrils daily. 07/09/15  Yes Tasrif Ahmed, MD  lisinopril-hydrochlorothiazide (PRINZIDE,ZESTORETIC) 20-12.5 MG per tablet Take 1 tablet by  mouth daily. 07/02/14  Yes Dow Adolph, MD  loratadine (CLARITIN) 10 MG tablet take 1 tablet by mouth once daily 08/14/14  Yes Dow Adolph, MD  omeprazole (PRILOSEC) 20 MG capsule Take 1 capsule (20 mg total) by mouth daily. 07/09/15  Yes Tasrif Ahmed, MD  ondansetron (ZOFRAN ODT) 4 MG disintegrating tablet Take 1 tablet (4 mg total) by mouth every 8 (eight) hours as needed for nausea or vomiting. 07/28/15   Jenifer Ernestina Penna, MD   BP 121/81 mmHg  Pulse 104  Temp(Src) 98.5 F (36.9 C) (Oral)  Resp 24  Ht  (1.549 m)  Wt 95.936 kg  BMI 39.98 kg/m2  SpO2 95% Physical Exam  Constitutional: She is oriented to person, place, and time. She appears well-developed and well-nourished. No distress.  HENT:  Head: Normocephalic and atraumatic.  Right Ear: External ear normal.  Left Ear: External ear normal.  Nose: Nose normal.  Mouth/Throat: Oropharynx is clear and moist. No oropharyngeal exudate.  Eyes: Conjunctivae and EOM are normal. Pupils are equal, round, and reactive to light. Right eye exhibits no discharge. Left eye exhibits no discharge.  Neck: Normal range of motion. Neck supple.  Cardiovascular: Regular rhythm and normal heart sounds.  Exam reveals no gallop and no friction rub.   No murmur heard. Tachycardic to 110 bpm  Pulmonary/Chest: Effort normal and breath sounds normal. No respiratory distress. She has no wheezes. She has no rales.  Abdominal: Soft. Bowel sounds are normal. She exhibits no distension and no mass. There is no tenderness. There is no rebound and no guarding.  Musculoskeletal: Normal range of motion. She exhibits no edema or tenderness.  Neurological: She is alert and oriented to person, place, and time. She exhibits normal muscle tone.  Skin: Skin is warm and dry. No rash noted. She is not diaphoretic.  Psychiatric: She has a normal mood and affect. Her behavior is normal. Judgment and thought content normal.    ED Course  Procedures (including  critical care time) Labs Review Labs Reviewed  CBC WITH DIFFERENTIAL/PLATELET - Abnormal; Notable for the following:    WBC 11.9 (*)    Neutro Abs 10.6 (*)    All other components within normal limits  COMPREHENSIVE METABOLIC PANEL - Abnormal; Notable for the following:    Glucose, Bld 119 (*)    Creatinine, Ser 1.03 (*)    Albumin 3.4 (*)    GFR calc non Af Amer 59 (*)    All other components within normal limits  LIPASE, BLOOD  URINALYSIS, ROUTINE W REFLEX MICROSCOPIC (NOT AT [facility])    Imaging Review Dg Chest 2 View  07/27/2015  CLINICAL DATA:  Acute onset of generalized chest pain and nausea. Initial encounter. EXAM: CHEST  2 VIEW COMPARISON:  None. FINDINGS: The lungs are well-aerated. Minimal left basilar atelectasis is noted. There is no evidence of pleural effusion or pneumothorax. The heart is normal in size; the mediastinal contour is within normal limits. No acute osseous abnormalities are seen. Mild right convex thoracic scoliosis is noted. IMPRESSION: Minimal left  basilar atelectasis noted.  Lungs otherwise clear. Electronically Signed   By: Roanna RaiderJeffery  Chang M.D.   On: 07/27/2015 23:28   I have personally reviewed and evaluated these images and lab results as part of my medical decision-making.   EKG Interpretation   Date/Time:  Sunday July 27 2015 23:00:45 EDT Ventricular Rate:  102 PR Interval:  169 QRS Duration: 74 QT Interval:  320 QTC Calculation: 417 R Axis:   -27 Text Interpretation:  Sinus tachycardia Probable left atrial enlargement  Borderline left axis deviation Borderline T wave abnormalities No  significant change since last tracing Confirmed by YAO  MD, DAVID ([id number])  on 07/27/2015 11:03:30 PM      MDM  Patient is generally well-appearing. Abdominal exam benign without tenderness. She continues to endorse nausea. EKG with no ischemic changes but sinus tachycardia. Patient denies chest pain and doubt cardiac etiology. Lipase within normal limits making  pancreatitis unlikely. CBC with mild leukocytosis, creatinine mildly elevated to 1.03 likely in the setting of some mild dehydration. Chest x-ray with no indication of pneumonia.  After receiving a liter of IV fluids as well as Zofran, patient reported symptomatic improvement and tachycardia improved. Suspect viral gastroenteritis versus food poisoning as the cause of her symptoms. Will discharge home with prescription for small amount of Zofran and strict precautions to return to the ED for vomiting with inability tolerate fluids by mouth, abdominal pain, fevers. She stable for discharge home with PCP follow-up.   Final diagnoses:  Gastroenteritis  Nausea vomiting and diarrhea     Jenifer Ernestina PennaBrunno Irick, MD 07/28/15 0015  Richardean Canalavid H Yao, MD 07/29/15 1622

## 2015-07-28 LAB — CBC WITH DIFFERENTIAL/PLATELET
Basophils Absolute: 0 10*3/uL (ref 0.0–0.1)
Basophils Relative: 0 %
EOS ABS: 0 10*3/uL (ref 0.0–0.7)
EOS PCT: 0 %
HEMATOCRIT: 39.6 % (ref 36.0–46.0)
HEMOGLOBIN: 13 g/dL (ref 12.0–15.0)
LYMPHS ABS: 0.7 10*3/uL (ref 0.7–4.0)
Lymphocytes Relative: 6 %
MCH: 29.7 pg (ref 26.0–34.0)
MCHC: 32.8 g/dL (ref 30.0–36.0)
MCV: 90.4 fL (ref 78.0–100.0)
MONO ABS: 0.6 10*3/uL (ref 0.1–1.0)
Monocytes Relative: 5 %
Neutro Abs: 10.6 10*3/uL — ABNORMAL HIGH (ref 1.7–7.7)
Neutrophils Relative %: 89 %
Platelets: 271 10*3/uL (ref 150–400)
RBC: 4.38 MIL/uL (ref 3.87–5.11)
RDW: 13.7 % (ref 11.5–15.5)
WBC: 11.9 10*3/uL — AB (ref 4.0–10.5)

## 2015-07-28 MED ORDER — ONDANSETRON 4 MG PO TBDP: 4.0000 mg | ORAL_TABLET | Freq: Three times a day (TID) | ORAL | Status: DC | PRN

## 2015-09-03 ENCOUNTER — Other Ambulatory Visit: Payer: Self-pay | Admitting: Internal Medicine

## 2015-09-03 NOTE — Telephone Encounter (Signed)
Pt needs an appt to discuss HTn as per last note- he was not taking his home BP med  Thanks

## 2015-10-06 ENCOUNTER — Encounter: Payer: Self-pay | Admitting: Internal Medicine

## 2015-10-06 ENCOUNTER — Ambulatory Visit (INDEPENDENT_AMBULATORY_CARE_PROVIDER_SITE_OTHER): Payer: Medicaid Other | Admitting: Internal Medicine

## 2015-10-06 VITALS — BP 121/82 | HR 88 | Temp 98.3°F | Wt 213.0 lb

## 2015-10-06 DIAGNOSIS — E785 Hyperlipidemia, unspecified: Secondary | ICD-10-CM | POA: Diagnosis not present

## 2015-10-06 DIAGNOSIS — Z0000 Encounter for general adult medical examination without abnormal findings: Secondary | ICD-10-CM

## 2015-10-06 DIAGNOSIS — Z6841 Body Mass Index (BMI) 40.0 and over, adult: Secondary | ICD-10-CM

## 2015-10-06 DIAGNOSIS — J309 Allergic rhinitis, unspecified: Secondary | ICD-10-CM | POA: Diagnosis not present

## 2015-10-06 DIAGNOSIS — I10 Essential (primary) hypertension: Secondary | ICD-10-CM | POA: Diagnosis not present

## 2015-10-06 MED ORDER — FLUTICASONE PROPIONATE 50 MCG/ACT NA SUSP: 1.0000 | Freq: Every day | NASAL | Status: DC

## 2015-10-06 MED ORDER — DICLOFENAC SODIUM 1 % TD GEL: 2.0000 g | Freq: Four times a day (QID) | TRANSDERMAL | Status: DC

## 2015-10-06 MED ORDER — LISINOPRIL-HYDROCHLOROTHIAZIDE 20-12.5 MG PO TABS: 1.0000 | ORAL_TABLET | Freq: Every day | ORAL | Status: DC

## 2015-10-06 MED ORDER — LORATADINE 10 MG PO TABS: 10.0000 mg | ORAL_TABLET | Freq: Every day | ORAL | Status: DC

## 2015-10-06 NOTE — Progress Notes (Signed)
Patient ID: Laretta Alstromebra A Suliman, female   DOB: 1955-07-27, 60 y.o.   MRN: [id number]     Subjective:   Patient ID: Laretta AlstromDebra A Fullard female   DOB: 1955-07-27 60 y.o.   MRN: [id number]  HPI: Ms.Keenan A Myna BrightKellam is a 60 y.o. woman with HTN, allergic rhinitis, HLD, severe obesity, who is here for a follow up visit.    Please see Problem List/A&P for the status of the patient's chronic medical problems      Past Medical History  Diagnosis Date  . Hypertension   . Arthritis   . Scoliosis   . Chest pain   . Health maintenance examination   . Low back pain   . Healthcare maintenance   . Osteoarthritis   . Other screening mammogram   . Tendonitis, Achilles, right   . Wrist pain   . Hyperlipidemia   . Obesity    Current Outpatient Prescriptions  Medication Sig Dispense Refill  . atorvastatin (LIPITOR) 40 MG tablet Take 1 tablet (40 mg total) by mouth daily. 60 tablet 3  . diclofenac sodium (VOLTAREN) 1 % GEL Apply 2 g topically 4 (four) times daily. 100 g 1  . fluticasone (FLONASE) 50 MCG/ACT nasal spray Place 1 spray into both nostrils daily. 16 g 2  . lisinopril-hydrochlorothiazide (PRINZIDE,ZESTORETIC) 20-12.5 MG tablet Take 1 tablet by mouth daily. 90 tablet 2  . omeprazole (PRILOSEC) 20 MG capsule Take 1 capsule (20 mg total) by mouth daily. 30 capsule 3  . acetaminophen (TYLENOL) 325 MG tablet Take 1-2 tablets (325-650 mg total) by mouth every 8 (eight) hours as needed. (Patient not taking: Reported on 10/06/2015) 60 tablet 2  . loratadine (CLARITIN) 10 MG tablet Take 1 tablet (10 mg total) by mouth daily. 30 tablet 11  . ondansetron (ZOFRAN ODT) 4 MG disintegrating tablet Take 1 tablet (4 mg total) by mouth every 8 (eight) hours as needed for nausea or vomiting. (Patient not taking: Reported on 10/06/2015) 7 tablet 0  . [DISCONTINUED] famotidine (PEPCID) 20 MG tablet Take 20 mg by mouth daily.       No current facility-administered medications for this visit.   Family History  Problem  Relation Age of Onset  . Diabetes Mother   . Hypertension Mother   . Cancer Cousin    Social History   Social History  . Marital Status: Divorced    Spouse Name: N/A  . Number of Children: N/A  . Years of Education: N/A   Social History Main Topics  . Smoking status: Former Smoker -- 0.50 packs/day for 2 years    Types: Cigarettes    Quit date: 11/24/1988  . Smokeless tobacco: None  . Alcohol Use: No  . Drug Use: No  . Sexual Activity: Not Asked   Other Topics Concern  . None   Social History Narrative   Review of Systems:  Constitutional: Negative for fever, chills, weight loss. Pt experiences some fatigue occasionally.  HEENT: has postnasal drip, periodic dry cough and itchy eyes  Respiratory: Negative for dyspnea  Gastrointestinal: Negative for heartburn, nausea, vomiting, abdominal pain, Musculoskeletal: Negative for myalgias, but has some back pain periodically  Neurological: Negative for tingling. No falls.    Objective:  Physical Exam: Filed Vitals:   10/06/15 1338  BP: 121/82  Pulse: 88  Temp: 98.3 F (36.8 C)  TempSrc: Oral  Weight: 213 lb (96.616 kg)  SpO2: 100%    General: A&O, in NAD HEENT:  sclera white, conjunctiva pink  CV: RRR,  normal s1, s2, no m/r/g, no carotid bruits appreciated Resp: equal and symmetric breath sounds, no wheezing or crackles  Abdomen: soft, nontender, nondistended Skin: warm, dry, intact, Neurologic: DTR: 1+ in both knees and symmetric   Assessment & Plan:   Please see problem based charting for assessment and plan

## 2015-10-06 NOTE — Assessment & Plan Note (Signed)
Blood pressure is 121/82, well controlled on lisinopril-hctz 20-12.5. Patient is compliant with her regimen   Continue current regimen and follow up in 3 months

## 2015-10-06 NOTE — Assessment & Plan Note (Signed)
Patient's weight has been stable around 210 lbs, and her BMI is > 40 which puts her in "Severe obesity range". Patient tried to exercise intermittently but does not have a regular regimen. She also does not watch her diet and eats a lot of processed foods.   i advised her moderate aerobic exercise 30 minutes a day for 5 days a week, or a total fo 150 minutes per week. I counseled on diet modification. Patient expressed interest in changing her dietary habits, and so I placed referral to Ms Plyler.   At the next visit, I will discuss with her the options of reducing her weight by bariatric surgery if she is interested- but first she needs to demonstrate willingness to work on diet and exercise.

## 2015-10-06 NOTE — Progress Notes (Signed)
Internal Medicine Clinic Attending  Case discussed with Dr. Saraiya at the time of the visit.  We reviewed the resident's history and exam and pertinent patient test results.  I agree with the assessment, diagnosis, and plan of care documented in the resident's note.  

## 2015-10-06 NOTE — Assessment & Plan Note (Signed)
Patient mentions that her 'allergies' have increased in the last few months. She only takes flonase one spray in the morning. She does not take claritin or other anti-histamines. She also experiences post-nasal drip. On HEENT exam, there was no erythema or pharyngeal exudates and no lymphadenopathy.   I advised the patient to start taking claritin every day, and to take flonase twice a day. RTC in 3 months if symptoms do not improve and then it may warrant a referral to allergy.

## 2015-10-06 NOTE — Patient Instructions (Signed)
Thank you for your visit today  Please continue to take your medications for blood pressure  Please take the claritin for your allergies, along with the nasal spray  Please meet with Ms. Lupita Leashonna- our nutrition specialist- for tips on weight management  I will call you about your lab results once they are back- about your cholesterol, thyroid, and other tests.   Please follow up for the colonoscopy

## 2015-10-06 NOTE — Assessment & Plan Note (Addendum)
Patient is on lipitor 40 mg daily and is compliant  10 year ASCVD risk 6.6 and lifetime ASCVD risk is 39%. Last LDL was 159 in 2014. She does not smoke. Moderate intensity statin would be sufficient for her but given that she is tolerating lipitor 40 mg fine, we will continue that.   Plan Recheck lipid panel Continue lipitor 40 mg daily

## 2015-10-06 NOTE — Assessment & Plan Note (Signed)
Ordered Hep C antibody testing Ordered colonoscopy  Ordered TSH  Patient defers pap smear

## 2015-10-06 NOTE — Assessment & Plan Note (Signed)
>>  ASSESSMENT AND PLAN FOR PREVENTATIVE HEALTH CARE WRITTEN ON 10/06/2015  3:20 PM BY Deneise Lever, MD  Ordered Hep C antibody testing Ordered colonoscopy  Ordered TSH  Patient defers pap smear

## 2015-10-07 LAB — HEPATITIS C ANTIBODY: Hep C Virus Ab: <0.1 s/co ratio (ref 0.0–0.9)

## 2015-10-07 LAB — LIPID PANEL
CHOL/HDL RATIO: 5.6 ratio — AB (ref 0.0–4.4)
Cholesterol, Total: 218 mg/dL — ABNORMAL HIGH (ref 100–199)
HDL: 39 mg/dL — ABNORMAL LOW (ref >39)
LDL CALC: 156 mg/dL — AB (ref 0–99)
TRIGLYCERIDES: 117 mg/dL (ref 0–149)
VLDL Cholesterol Cal: 23 mg/dL (ref 5–40)

## 2015-10-07 LAB — TSH: TSH: 1.13 u[IU]/mL (ref 0.450–4.500)

## 2015-10-13 NOTE — Addendum Note (Signed)
Addended by: Neomia DearPOWERS, Emerita Berkemeier E on: 10/13/2015 09:29 AM   Modules accepted: Orders

## 2015-10-20 ENCOUNTER — Ambulatory Visit: Payer: Medicaid Other | Admitting: Dietician

## 2015-10-27 NOTE — Addendum Note (Signed)
Addended by: Neomia DearPOWERS, Krishawna Stiefel E on: 10/27/2015 06:25 PM   Modules accepted: Orders

## 2015-11-26 IMAGING — CR DG LUMBAR SPINE COMPLETE 4+V
5 series · 5 of 5 positions shown · non-contrast
Comparison: 01/18/2011

CLINICAL DATA: Chronic low back pain.

EXAM:
LUMBAR SPINE - COMPLETE 4+ VIEW

[t lumbar spine ap]
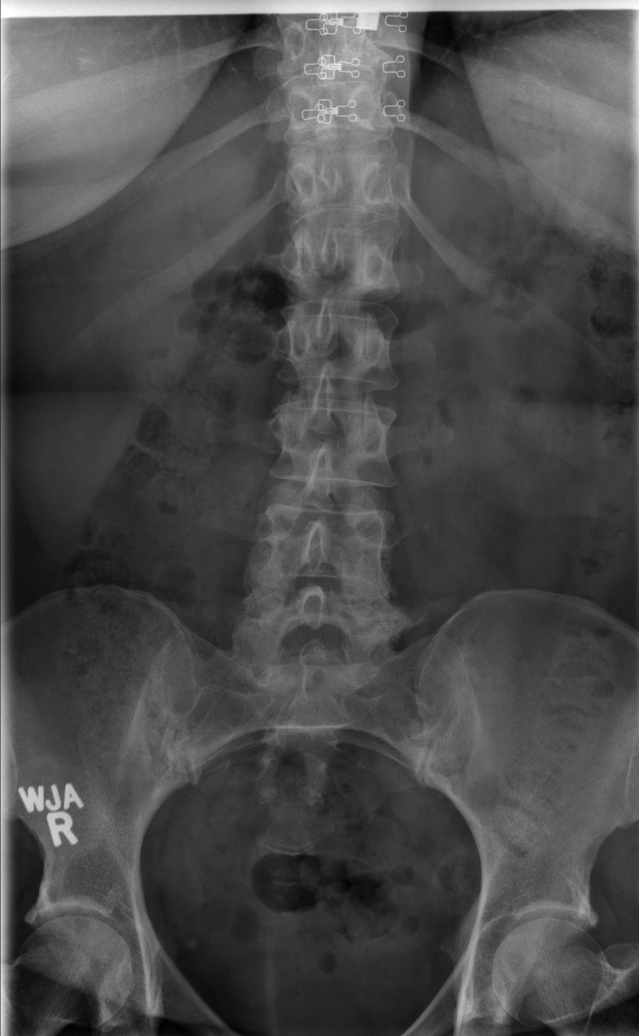

[t lumbar spine obl (1 of 2)]
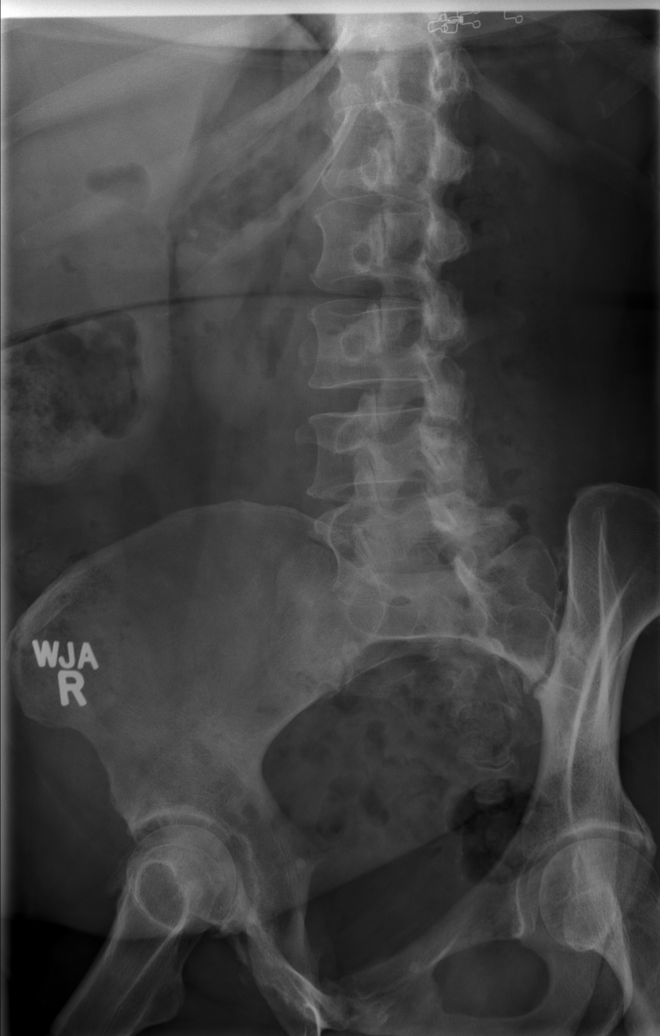

[t lumbar spine obl (2 of 2)]
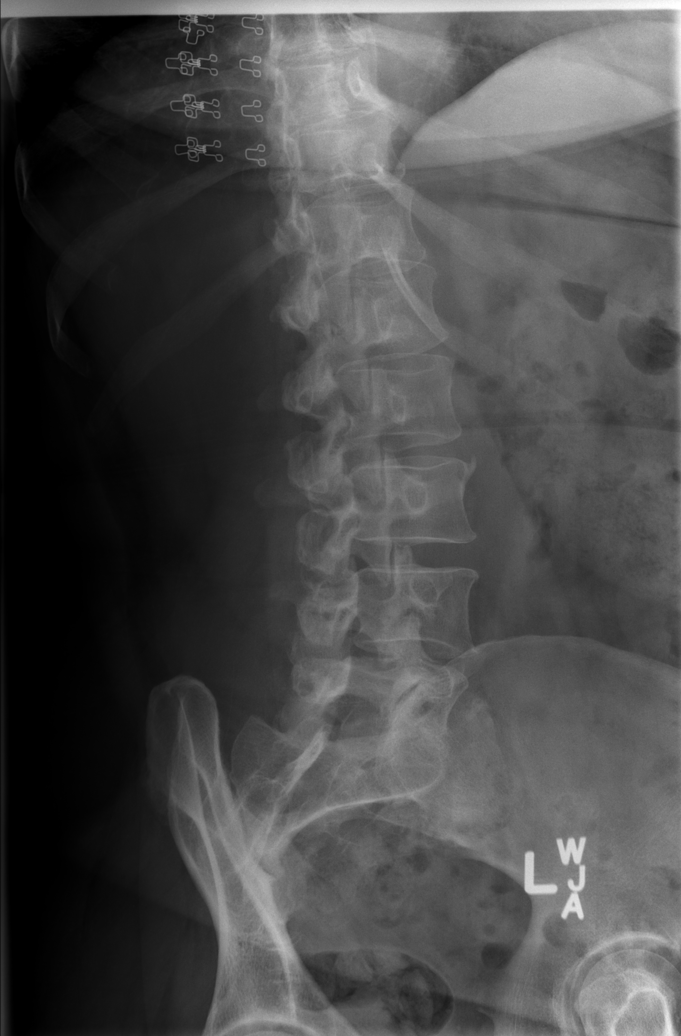

[t lumbar spine lat]
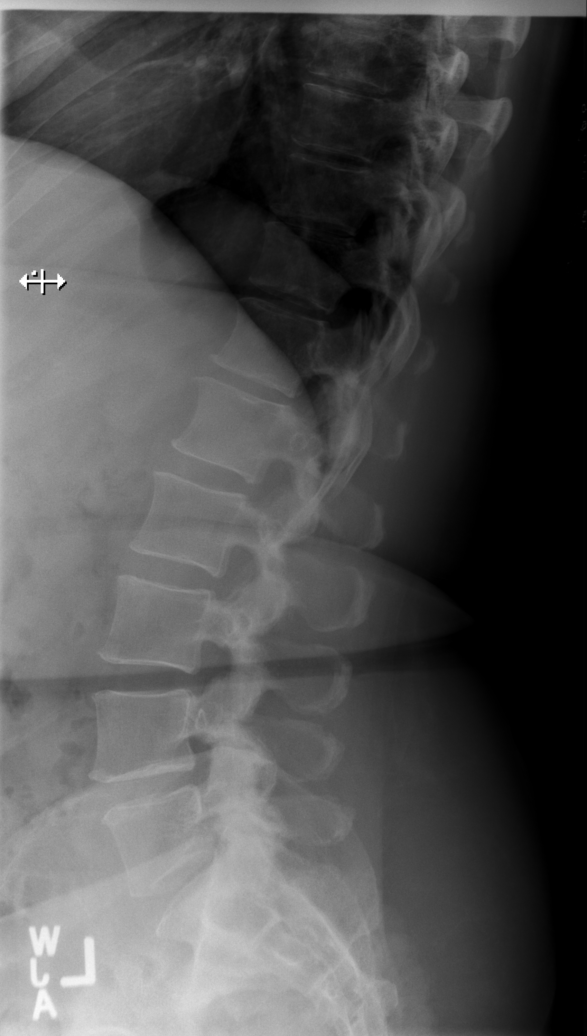

[t lumbar l-5 s-1 spot]
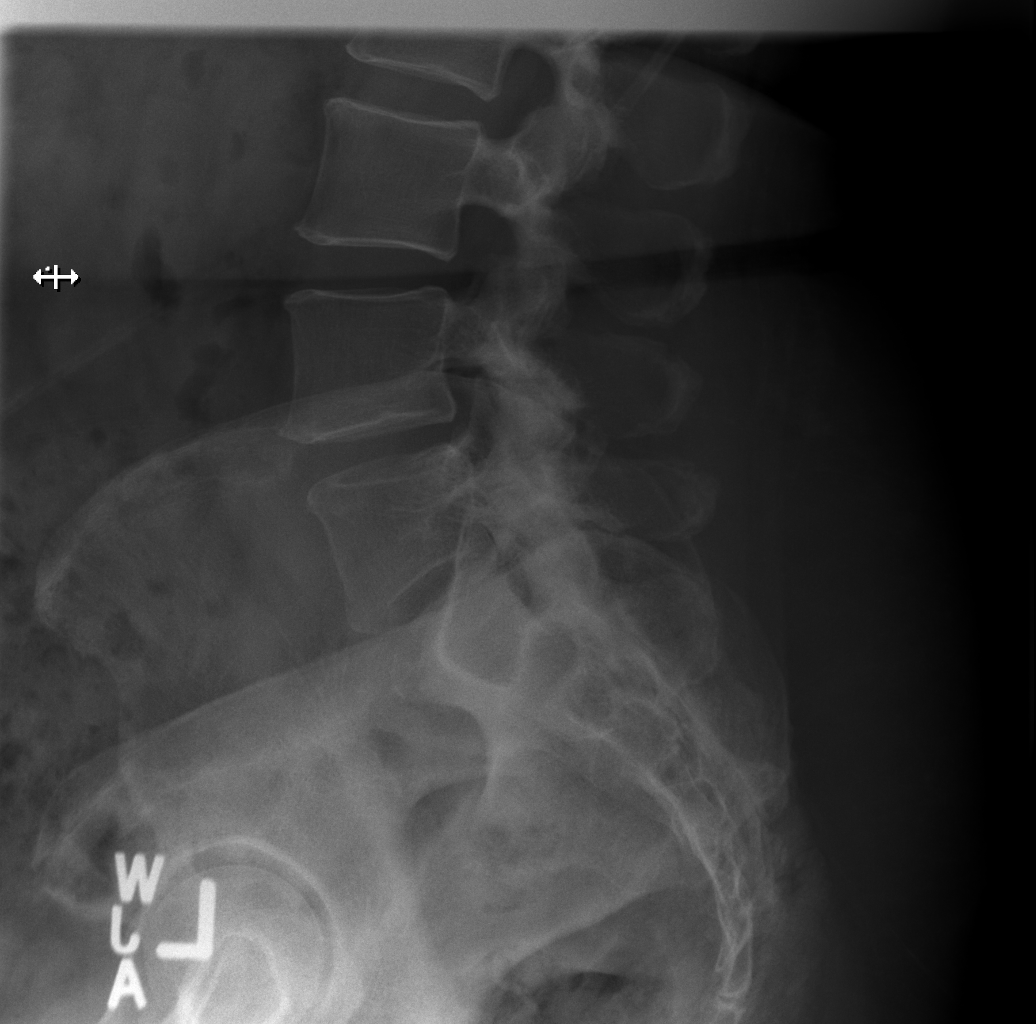

[5 of 5 positions shown; findings below may reference images not displayed]

FINDINGS: No fracture. No spondylolisthesis. Mild loss of disc height at
L4-L5. Remaining disc spaces are well preserved. There are minor
endplate osteophytes along the lower thoracic and lower lumbar
spine. Facet joint degenerative changes noted bilaterally at L4-L5
and L5-S1.

Soft tissues are unremarkable.
IMPRESSION: No fracture or acute finding. Lower lumbar spine degenerative
changes as described. Findings are relatively stable when compared
to the prior study.

## 2016-02-17 ENCOUNTER — Other Ambulatory Visit: Payer: Self-pay

## 2016-02-17 DIAGNOSIS — K219 Gastro-esophageal reflux disease without esophagitis: Secondary | ICD-10-CM

## 2016-02-17 MED ORDER — OMEPRAZOLE 20 MG PO CPDR: 20.0000 mg | DELAYED_RELEASE_CAPSULE | Freq: Every day | ORAL | 3 refills | Status: DC

## 2016-02-17 NOTE — Telephone Encounter (Signed)
Requesting omeprazole to be filled @ [facility] on [address].

## 2016-04-20 ENCOUNTER — Ambulatory Visit (HOSPITAL_COMMUNITY)
Admission: EM | Admit: 2016-04-20 | Discharge: 2016-04-20 | Disposition: A | Discharge status: Home / Self Care | Payer: Medicaid Other | Attending: Family Medicine | Admitting: Family Medicine

## 2016-04-20 ENCOUNTER — Encounter (HOSPITAL_COMMUNITY): Payer: Self-pay | Admitting: Emergency Medicine

## 2016-04-20 DIAGNOSIS — H9202 Otalgia, left ear: Secondary | ICD-10-CM | POA: Diagnosis not present

## 2016-04-20 DIAGNOSIS — H6502 Acute serous otitis media, left ear: Secondary | ICD-10-CM | POA: Diagnosis not present

## 2016-04-20 MED ORDER — NEOMYCIN-POLYMYXIN-HC 3.5-10000-1 OT SUSP: 4.0000 [drp] | Freq: Three times a day (TID) | OTIC | 0 refills | Status: DC

## 2016-04-20 MED ORDER — AMOXICILLIN 875 MG PO TABS: 875.0000 mg | ORAL_TABLET | Freq: Two times a day (BID) | ORAL | 0 refills | Status: DC

## 2016-04-20 NOTE — ED Provider Notes (Signed)
CSN: [id number]     Arrival date & time 04/20/16  1433 History   None    Chief Complaint  Patient presents with  . Otalgia   (Consider location/radiation/quality/duration/timing/severity/associated sxs/prior Treatment) Patient c/o right ear discomfort and uri sx's for over 2 days.   The history is provided by the patient.  Otalgia  Location:  Right Behind ear:  No abnormality Quality:  Aching Severity:  Moderate Onset quality:  Sudden Duration:  2 days Timing:  Constant Progression:  Worsening Chronicity:  New Relieved by:  None tried Worsened by:  Nothing Ineffective treatments:  None tried   Past Medical History:  Diagnosis Date  . Arthritis   . Chest pain   . Health maintenance examination   . Healthcare maintenance   . Hyperlipidemia   . Hypertension   . Low back pain   . Obesity   . Osteoarthritis   . Other screening mammogram   . Scoliosis   . Tendonitis, Achilles, right   . Wrist pain    Past Surgical History:  Procedure Laterality Date  . BREAST LUMPECTOMY    . TUBAL LIGATION     Family History  Problem Relation Age of Onset  . Diabetes Mother   . Hypertension Mother   . Cancer Cousin    Social History  Substance Use Topics  . Smoking status: Former Smoker    Packs/day: 0.50    Years: 2.00    Types: Cigarettes    Quit date: 11/24/1988  . Smokeless tobacco: Not on file  . Alcohol use No   OB History    No data available     Review of Systems  Constitutional: Positive for fatigue.  HENT: Positive for ear pain.   Eyes: Negative.   Respiratory: Negative.   Cardiovascular: Negative.   Gastrointestinal: Negative.   Endocrine: Negative.   Musculoskeletal: Negative.   Allergic/Immunologic: Negative.   Neurological: Negative.   Hematological: Negative.   Psychiatric/Behavioral: Negative.     Allergies  Codeine and Fish-derived products  Home Medications   Prior to Admission medications   Medication Sig Start Date End Date Taking?  Authorizing Provider  lisinopril-hydrochlorothiazide (PRINZIDE,ZESTORETIC) 20-12.5 MG tablet Take 1 tablet by mouth daily. 10/06/15  Yes Deneise LeverParth Saraiya, MD  loratadine (CLARITIN) 10 MG tablet Take 1 tablet (10 mg total) by mouth daily. 10/06/15  Yes Deneise LeverParth Saraiya, MD  omeprazole (PRILOSEC) 20 MG capsule Take 1 capsule (20 mg total) by mouth daily. 02/17/16  Yes Tyson Aliasuncan Thomas Vincent, MD  acetaminophen (TYLENOL) 325 MG tablet Take 1-2 tablets (325-650 mg total) by mouth every 8 (eight) hours as needed. Patient not taking: Reported on 04/20/2016 02/27/14   Dow Adolphichard Kazibwe, MD  amoxicillin (AMOXIL) 875 MG tablet Take 1 tablet (875 mg total) by mouth 2 (two) times daily. 04/20/16   Deatra CanterWilliam J Oxford, FNP  atorvastatin (LIPITOR) 40 MG tablet Take 1 tablet (40 mg total) by mouth daily. 07/09/15   Tasrif Ahmed, MD  diclofenac sodium (VOLTAREN) 1 % GEL Apply 2 g topically 4 (four) times daily. 10/06/15   Deneise LeverParth Saraiya, MD  fluticasone (FLONASE) 50 MCG/ACT nasal spray Place 1 spray into both nostrils daily. 10/06/15   Deneise LeverParth Saraiya, MD  neomycin-polymyxin-hydrocortisone (CORTISPORIN) 3.5-10000-1 otic suspension Place 4 drops into the left ear 3 (three) times daily. 04/20/16   Deatra CanterWilliam J Oxford, FNP  ondansetron (ZOFRAN ODT) 4 MG disintegrating tablet Take 1 tablet (4 mg total) by mouth every 8 (eight) hours as needed for nausea or vomiting. Patient not taking:  Reported on 04/20/2016 07/28/15   Charlie PitterJenifer Brunno Irick, MD   Meds Ordered and Administered this Visit  Medications - No data to display  BP 120/79 (BP Location: Left Arm)   Pulse 83   Temp 99.1 F (37.3 C) (Oral)   Resp 14   SpO2 97%  No data found.   Physical Exam  Constitutional: She is oriented to person, place, and time. She appears well-developed and well-nourished.  HENT:  Head: Normocephalic and atraumatic.  Left Ear: External ear normal.  Mouth/Throat: Oropharynx is clear and moist.  Right TM erythematous  Eyes: Conjunctivae and EOM are  normal. Pupils are equal, round, and reactive to light.  Neck: Normal range of motion. Neck supple.  Cardiovascular: Normal rate, regular rhythm and normal heart sounds.   Pulmonary/Chest: Effort normal and breath sounds normal.  Neurological: She is alert and oriented to person, place, and time.  Nursing note and vitals reviewed.   Urgent Care Course   Clinical Course     Procedures (including critical care time)  Labs Review Labs Reviewed - No data to display  Imaging Review No results found.   Visual Acuity Review  Right Eye Distance:   Left Eye Distance:   Bilateral Distance:    Right Eye Near:   Left Eye Near:    Bilateral Near:         MDM   1. Acute serous otitis media of left ear, recurrence not specified   2. Left ear pain    Amoxicillin 875mg  one po bid x 7 days #14 Cortisporin otic ear drops 2-4 gtt's affected ear qid prn #6610ml Push po fluids, rest, tylenol and motrin otc prn as directed for fever, arthralgias, and myalgias.  Follow up prn if sx's continue or persist.    Deatra CanterWilliam J Oxford, FNP 04/20/16 [phone]

## 2016-04-20 NOTE — ED Triage Notes (Signed)
[facility] a [facility] right side ear pain with a cough and sore throat x 2 days.

## 2016-05-15 ENCOUNTER — Encounter (HOSPITAL_COMMUNITY): Payer: Self-pay | Admitting: Family Medicine

## 2016-05-15 ENCOUNTER — Ambulatory Visit (HOSPITAL_COMMUNITY)
Admission: EM | Admit: 2016-05-15 | Discharge: 2016-05-15 | Disposition: A | Discharge status: Home / Self Care | Payer: Medicaid Other | Attending: Internal Medicine | Admitting: Internal Medicine

## 2016-05-15 DIAGNOSIS — H9201 Otalgia, right ear: Secondary | ICD-10-CM | POA: Diagnosis not present

## 2016-05-15 NOTE — ED Triage Notes (Signed)
Pt here for reoccurrence of right ear pain. Recently treated for infection.

## 2016-05-15 NOTE — ED Provider Notes (Signed)
CSN: [id number]     Arrival date & time 05/15/16  1445 History   First MD Initiated Contact with Patient 05/15/16 1625     Chief Complaint  Patient presents with  . Ear Pain   (Consider location/radiation/quality/duration/timing/severity/associated sxs/prior Treatment) Recently diagnosed with AOM on 12/19 and was treated then. Patient is afraid that she may have another episode of an ear infection; would like to be evaluated to make sure. Reports her current ear pain is mild and is much less compared to her ear infection 1 month ago. Patient feels like there is fluid inside her right ear.   The history is provided by the patient.  Otalgia  Location:  Right Behind ear:  No abnormality Quality:  Aching Severity:  Mild Onset quality:  Gradual Duration:  2 days Timing:  Constant Progression:  Unchanged Chronicity:  Recurrent (was diagnosed with AOM 1 month ago) Associated symptoms: cough, headaches and tinnitus   Associated symptoms: no abdominal pain, no congestion, no fever, no hearing loss, no rash, no rhinorrhea, no sore throat and no vomiting   Associated symptoms comment:  +PND, "felt like something was leaking out of the right ear"    Past Medical History:  Diagnosis Date  . Arthritis   . Chest pain   . Health maintenance examination   . Healthcare maintenance   . Hyperlipidemia   . Hypertension   . Low back pain   . Obesity   . Osteoarthritis   . Other screening mammogram   . Scoliosis   . Tendonitis, Achilles, right   . Wrist pain    Past Surgical History:  Procedure Laterality Date  . BREAST LUMPECTOMY    . TUBAL LIGATION     Family History  Problem Relation Age of Onset  . Diabetes Mother   . Hypertension Mother   . Cancer Cousin    Social History  Substance Use Topics  . Smoking status: Former Smoker    Packs/day: 0.50    Years: 2.00    Types: Cigarettes    Quit date: 11/24/1988  . Smokeless tobacco: Never Used  . Alcohol use No   OB History     No data available     Review of Systems  Constitutional: Negative for fever.  HENT: Positive for ear pain and tinnitus. Negative for congestion, hearing loss, rhinorrhea and sore throat.   Respiratory: Positive for cough. Negative for shortness of breath.   Cardiovascular: Negative for chest pain.  Gastrointestinal: Negative for abdominal pain and vomiting.  Skin: Negative for rash.  Neurological: Positive for headaches. Negative for dizziness.    Allergies  Codeine and Fish-derived products  Home Medications   Prior to Admission medications   Medication Sig Start Date End Date Taking? Authorizing Provider  acetaminophen (TYLENOL) 325 MG tablet Take 1-2 tablets (325-650 mg total) by mouth every 8 (eight) hours as needed. Patient not taking: Reported on 04/20/2016 02/27/14   Dow Adolph, MD  amoxicillin (AMOXIL) 875 MG tablet Take 1 tablet (875 mg total) by mouth 2 (two) times daily. 04/20/16   Deatra Canter, FNP  atorvastatin (LIPITOR) 40 MG tablet Take 1 tablet (40 mg total) by mouth daily. 07/09/15   Tasrif Ahmed, MD  diclofenac sodium (VOLTAREN) 1 % GEL Apply 2 g topically 4 (four) times daily. 10/06/15   Deneise Lever, MD  fluticasone (FLONASE) 50 MCG/ACT nasal spray Place 1 spray into both nostrils daily. 10/06/15   Deneise Lever, MD  lisinopril-hydrochlorothiazide (PRINZIDE,ZESTORETIC) 20-12.5 MG tablet Take 1  tablet by mouth daily. 10/06/15   Deneise LeverParth Saraiya, MD  loratadine (CLARITIN) 10 MG tablet Take 1 tablet (10 mg total) by mouth daily. 10/06/15   Deneise LeverParth Saraiya, MD  neomycin-polymyxin-hydrocortisone (CORTISPORIN) 3.5-10000-1 otic suspension Place 4 drops into the left ear 3 (three) times daily. 04/20/16   Deatra CanterWilliam J Oxford, FNP  omeprazole (PRILOSEC) 20 MG capsule Take 1 capsule (20 mg total) by mouth daily. 02/17/16   Tyson Aliasuncan Thomas Vincent, MD  ondansetron (ZOFRAN ODT) 4 MG disintegrating tablet Take 1 tablet (4 mg total) by mouth every 8 (eight) hours as needed for nausea or  vomiting. Patient not taking: Reported on 04/20/2016 07/28/15   Charlie PitterJenifer Brunno Irick, MD   Meds Ordered and Administered this Visit  Medications - No data to display  BP 135/81 (BP Location: Right Arm)   Pulse 79   Temp 98.1 F (36.7 C) (Oral)   Resp 16   SpO2 97%  No data found.   Physical Exam  Constitutional: She is oriented to person, place, and time. She appears well-developed and well-nourished.  HENT:  Head: Normocephalic and atraumatic.  Right Ear: External ear normal.  Left Ear: External ear normal.  Mouth/Throat: Oropharynx is clear and moist. No oropharyngeal exudate.  TM pearly gray bilaterally with no erythema noted. TM are intact with no perforation. No serous fluid noted  Eyes: Conjunctivae are normal. Pupils are equal, round, and reactive to light.  Neck: Normal range of motion. Neck supple.  Cardiovascular: Normal rate, regular rhythm and normal heart sounds.   Pulmonary/Chest: Effort normal and breath sounds normal.  Lymphadenopathy:    She has no cervical adenopathy.  Neurological: She is alert and oriented to person, place, and time. No cranial nerve deficit. Coordination normal.  Skin: Skin is warm and dry.  Nursing note and vitals reviewed.   Urgent Care Course   Clinical Course     Procedures (including critical care time)  Labs Review Labs Reviewed - No data to display  Imaging Review No results found.  MDM   1. Otalgia of right ear    No clear etiology for the otalgia. Exam unremarkable with no concern for an ear infection. Patient informed to continue to monitor the ear pain, if the ear pain persist or continues, then please follow up with primary care doctor for reevaluation. Informed that she may use Flonase 2 sprays each nostrils daily for the day 5-7 days to helps with the "fluid in the ear", informed that this is usually self limiting.     Lucia EstelleFeng Deneka Greenwalt, NP 05/15/16 [phone]

## 2016-05-15 NOTE — Discharge Instructions (Signed)
I did not see ear infection on exam. Please continue to monitor ear pain. If the ear pain does not resolve, please follow up with primary care doctor.  You may do Flonase 2 sprays each nostril daily for the next 5-7 days to help with the fluid in the ear.

## 2016-06-02 ENCOUNTER — Other Ambulatory Visit: Payer: Self-pay

## 2016-06-02 DIAGNOSIS — J309 Allergic rhinitis, unspecified: Secondary | ICD-10-CM

## 2016-06-02 MED ORDER — FLUTICASONE PROPIONATE 50 MCG/ACT NA SUSP: 1.0000 | Freq: Every day | NASAL | 5 refills | Status: DC

## 2016-06-02 NOTE — Telephone Encounter (Signed)
fluticasone (FLONASE) 50 MCG/ACT nasal spray, Refill request @ [facility] on [address]

## 2016-07-05 ENCOUNTER — Encounter: Payer: Medicaid Other | Admitting: Internal Medicine

## 2016-07-13 ENCOUNTER — Other Ambulatory Visit: Payer: Self-pay | Admitting: Internal Medicine

## 2016-07-13 NOTE — Telephone Encounter (Signed)
lisinopril-hydrochlorothiazide (PRINZIDE,ZESTORETIC) 20-12.5 MG tablet  [facility] [address]

## 2016-07-14 MED ORDER — LISINOPRIL-HYDROCHLOROTHIAZIDE 20-12.5 MG PO TABS: 1.0000 | ORAL_TABLET | Freq: Every day | ORAL | 0 refills | Status: DC

## 2016-07-14 NOTE — Telephone Encounter (Signed)
Pt has not been seen in over 6 months and needs to come to appt on 4/9 for further refills, But giving her 30 day supply

## 2016-08-09 ENCOUNTER — Encounter: Payer: Medicaid Other | Admitting: Internal Medicine

## 2016-09-07 ENCOUNTER — Other Ambulatory Visit: Payer: Self-pay

## 2016-09-07 MED ORDER — LISINOPRIL-HYDROCHLOROTHIAZIDE 20-12.5 MG PO TABS: 1.0000 | ORAL_TABLET | Freq: Every day | ORAL | 0 refills | Status: DC

## 2016-09-07 NOTE — Telephone Encounter (Signed)
I have given 30 day supply- pt needs to come to 5/21 appt for further refills  Thanks

## 2016-09-07 NOTE — Telephone Encounter (Signed)
Received refill request from patient.  Pt was a no show for last appt. I explained to pateint that she must keep her appt on 09/20/2016 and I will send refill request to her pcp for review and that it was up to her MD to decide on whether or not to refill rx.  Pt stated she will be sure to keep upcoming appt.  Will send refill request to pcp for review, please advise.Kingsley SpittleGoldston, Paiton B[place]house Cassady5/8/201811:34 AM

## 2016-09-07 NOTE — Telephone Encounter (Signed)
lisinopril-hydrochlorothiazide (PRINZIDE,ZESTORETIC) 20-12.5 MG tablet, refill request @ [facility] on [address]

## 2016-09-17 ENCOUNTER — Telehealth: Payer: Self-pay | Admitting: Internal Medicine

## 2016-09-17 NOTE — Telephone Encounter (Signed)
APT. REMINDER CALL, NO ANSWER, NO VOICEMAIL °

## 2016-09-20 ENCOUNTER — Encounter (INDEPENDENT_AMBULATORY_CARE_PROVIDER_SITE_OTHER): Payer: Self-pay

## 2016-09-20 ENCOUNTER — Ambulatory Visit (INDEPENDENT_AMBULATORY_CARE_PROVIDER_SITE_OTHER): Payer: Medicaid Other | Admitting: Internal Medicine

## 2016-09-20 ENCOUNTER — Encounter: Payer: Self-pay | Admitting: Internal Medicine

## 2016-09-20 VITALS — BP 109/77 | HR 83 | Temp 98.2°F | Ht 61.0 in | Wt 218.9 lb

## 2016-09-20 DIAGNOSIS — Z1231 Encounter for screening mammogram for malignant neoplasm of breast: Secondary | ICD-10-CM

## 2016-09-20 DIAGNOSIS — I10 Essential (primary) hypertension: Secondary | ICD-10-CM

## 2016-09-20 DIAGNOSIS — J309 Allergic rhinitis, unspecified: Secondary | ICD-10-CM

## 2016-09-20 DIAGNOSIS — K219 Gastro-esophageal reflux disease without esophagitis: Secondary | ICD-10-CM

## 2016-09-20 DIAGNOSIS — E785 Hyperlipidemia, unspecified: Secondary | ICD-10-CM | POA: Diagnosis not present

## 2016-09-20 DIAGNOSIS — Z79899 Other long term (current) drug therapy: Secondary | ICD-10-CM

## 2016-09-20 DIAGNOSIS — E669 Obesity, unspecified: Secondary | ICD-10-CM

## 2016-09-20 DIAGNOSIS — R739 Hyperglycemia, unspecified: Secondary | ICD-10-CM | POA: Diagnosis present

## 2016-09-20 DIAGNOSIS — Z0000 Encounter for general adult medical examination without abnormal findings: Secondary | ICD-10-CM

## 2016-09-20 DIAGNOSIS — J3089 Other allergic rhinitis: Secondary | ICD-10-CM | POA: Diagnosis not present

## 2016-09-20 DIAGNOSIS — Z87891 Personal history of nicotine dependence: Secondary | ICD-10-CM | POA: Diagnosis not present

## 2016-09-20 LAB — POCT GLYCOSYLATED HEMOGLOBIN (HGB A1C): HEMOGLOBIN A1C: 5.9

## 2016-09-20 LAB — GLUCOSE, CAPILLARY: Glucose-Capillary: 131 mg/dL — ABNORMAL HIGH (ref 65–99)

## 2016-09-20 MED ORDER — ATORVASTATIN CALCIUM 40 MG PO TABS: 40.0000 mg | ORAL_TABLET | Freq: Every day | ORAL | 3 refills | Status: DC

## 2016-09-20 MED ORDER — DICLOFENAC SODIUM 1 % TD GEL: 2.0000 g | Freq: Four times a day (QID) | TRANSDERMAL | 1 refills | Status: DC

## 2016-09-20 MED ORDER — RANITIDINE HCL 150 MG PO TABS: 150.0000 mg | ORAL_TABLET | Freq: Two times a day (BID) | ORAL | 1 refills | Status: DC

## 2016-09-20 MED ORDER — LISINOPRIL-HYDROCHLOROTHIAZIDE 20-12.5 MG PO TABS: 1.0000 | ORAL_TABLET | Freq: Every day | ORAL | 3 refills | Status: DC

## 2016-09-20 MED ORDER — LORATADINE 10 MG PO TABS: 10.0000 mg | ORAL_TABLET | Freq: Every day | ORAL | 11 refills | Status: DC

## 2016-09-20 NOTE — Assessment & Plan Note (Signed)
BP Readings from Last 3 Encounters:  09/20/16 109/77  05/15/16 135/81  04/20/16 120/79   Patient is compliant with her lisinopril-hctz and blood pressure is well controlled on it.  Plan -continue lisinopril-hctz 20-12.5 mg daily

## 2016-09-20 NOTE — Assessment & Plan Note (Signed)
>>  ASSESSMENT AND PLAN FOR PREVENTATIVE HEALTH CARE WRITTEN ON 09/21/2016 10:53 AM BY Deneise Lever, MD  Patient is up to date on pap smear- last one showed HPV negative also so next pap smear due in Oct 2020.  Patient declined colonoscopy but opted for FIT screening.  Referral placed for mammogram  Screened for HIV and also repeat BMET obtained today   Deferred discussion for Shingrix due to multiple health maintenance issues done today   Addendum: 5/22  Informed the pt on phone that Her HIV test is nonreactive and BMEt is unremarkable. Pt indicated understanding of that.

## 2016-09-20 NOTE — Assessment & Plan Note (Signed)
Patient was amenable to checking her A1c today and explained that her insurance may pay for it or may not.  Her A1c today was 5.9 and so she has impaired fasting glucose/ prediabetes.   Plan Continue to work on lifestyle modification

## 2016-09-20 NOTE — Assessment & Plan Note (Signed)
Patient's weight has been 218 lbs. She does not exercise, and she drinks regular sodas, and fatty foods.   I explained lifestyle modification and exercise and she is willing to work on it.  Plan -work on lifestyle modification

## 2016-09-20 NOTE — Assessment & Plan Note (Signed)
Patient is currently taking omeprazole daily. Says she has some reflux symptoms about 5 hours after eating dinner which happen around 1 AM.   Patient is willing to try a different agent and I explained that omeprazole over long term can have adverse side effects and that she should take H2 blocker if she is able to control her symptoms on that. She is willing to switch therapy.  Plan -switched to ranitidine BID

## 2016-09-20 NOTE — Assessment & Plan Note (Signed)
Patient is on lipitor 40 mg daily but has not taken for 2 months as she was out of it. Lipid panel was last checked in 2017 and LDl was in the 150s.  Plan -resume lipitor 40 mg daily.

## 2016-09-20 NOTE — Assessment & Plan Note (Addendum)
Patient is up to date on pap smear- last one showed HPV negative also so next pap smear due in Oct 2020.  Patient declined colonoscopy but opted for FIT screening.  Referral placed for mammogram  Screened for HIV and also repeat BMET obtained today   Deferred discussion for Shingrix due to multiple health maintenance issues done today   Addendum: 5/22  Informed the pt on phone that Her HIV test is nonreactive and BMEt is unremarkable. Pt indicated understanding of that.

## 2016-09-20 NOTE — Assessment & Plan Note (Signed)
Patient takes flonase BID and claritin daily and says that her symptoms are well controlled on both.  Plan -continue both medications

## 2016-09-20 NOTE — Progress Notes (Signed)
    CC:  HTN, HM, HLD, GERD, Obesity, Hyperglycemia, allergic rhinitis  HPI: [name] is a 61 y.o. woman with PMH noted below here for HTN, HM, HLD, GERD, Obesity, Hyperglycemia, allergic rhinitis    Please see Problem List/A&P for the status of the patient's chronic medical problems   Past Medical History:  Diagnosis Date  . Arthritis   . Chest pain   . Health maintenance examination   . Healthcare maintenance   . Hyperlipidemia   . Hypertension   . Low back pain   . Obesity   . Osteoarthritis   . Other screening mammogram   . Scoliosis   . Tendonitis, Achilles, right   . Wrist pain     Review of Systems: Denies fevers, chills, fatigue, but has had about 5 lbs weight gain over few months Denies cough, SOB, wheezing Denies n/v/diarrhea, has some reflux symptoms off and on. Denies falls or joint pain  Physical Exam: Vitals:   09/20/16 1337  BP: 109/77  Pulse: 83  Temp: 98.2 F (36.8 C)  TempSrc: Oral  SpO2: 99%  Weight: 218 lb 14.4 oz (99.3 kg)  Height: 5\' 1"  (1.549 m)    General: A&O, in NAD, obese Neck: supple, midline trachea, thyroid nonpalpable , some acanthosis nigricans present on the anterior neck  CV: RRR, normal s1, s2, no m/r/g, no carotid bruits appreciated Resp: equal and symmetric breath sounds, no wheezing or crackles  Abdomen: soft, nontender, nondistended, +BS Skin: warm, dry, intact, no open lesions  Extremities: pulses intact b/l, trace pitting edema    Assessment & Plan:   See encounters tab for problem based medical decision making. Patient discussed with Dr. Criselda PeachesMullen

## 2016-09-20 NOTE — Patient Instructions (Addendum)
Thank you for your visit today  Please work on your diet and exercise more- you have "Pre-diabetes"- so you can work on changing your lifestyle as we talked about  Please stop the omeprazole and take the ranitidine for your heartburn   Please continue to take your blood pressure medications  I will call you about your lab results   Please follow up in 3 months

## 2016-09-21 LAB — BMP8+ANION GAP
Anion Gap: 11 mmol/L (ref 10.0–18.0)
BUN/Creatinine Ratio: 14 (ref 9–23)
BUN: 13 mg/dL (ref 6–24)
CALCIUM: 9 mg/dL (ref 8.7–10.2)
CO2: 26 mmol/L (ref 18–29)
Chloride: 106 mmol/L (ref 96–106)
Creatinine, Ser: 0.93 mg/dL (ref 0.57–1.00)
GFR calc Af Amer: 78 mL/min/{1.73_m2} (ref >59)
GFR calc non Af Amer: 67 mL/min/{1.73_m2} (ref >59)
GLUCOSE: 102 mg/dL — AB (ref 65–99)
POTASSIUM: 4.1 mmol/L (ref 3.5–5.2)
SODIUM: 143 mmol/L (ref 134–144)

## 2016-09-21 LAB — HIV ANTIBODY (ROUTINE TESTING W REFLEX): HIV Screen 4th Generation wRfx: NONREACTIVE

## 2016-09-21 NOTE — Progress Notes (Signed)
Internal Medicine Clinic Attending  Case discussed with Dr. Saraiya at the time of the visit.  We reviewed the resident's history and exam and pertinent patient test results.  I agree with the assessment, diagnosis, and plan of care documented in the resident's note.  

## 2017-06-08 ENCOUNTER — Ambulatory Visit: Payer: Medicaid Other | Admitting: Internal Medicine

## 2017-06-08 ENCOUNTER — Other Ambulatory Visit: Payer: Self-pay

## 2017-06-08 ENCOUNTER — Encounter: Payer: Self-pay | Admitting: Internal Medicine

## 2017-06-08 VITALS — BP 138/88 | HR 71 | Temp 98.2°F | Wt 217.9 lb

## 2017-06-08 DIAGNOSIS — I10 Essential (primary) hypertension: Secondary | ICD-10-CM | POA: Diagnosis not present

## 2017-06-08 DIAGNOSIS — M170 Bilateral primary osteoarthritis of knee: Secondary | ICD-10-CM

## 2017-06-08 DIAGNOSIS — E785 Hyperlipidemia, unspecified: Secondary | ICD-10-CM | POA: Diagnosis not present

## 2017-06-08 MED ORDER — MELOXICAM 7.5 MG PO TABS: 7.5000 mg | ORAL_TABLET | Freq: Every day | ORAL | 0 refills | Status: AC

## 2017-06-08 MED ORDER — KETOROLAC TROMETHAMINE 30 MG/ML IJ SOLN
30.0000 mg | Freq: Once | INTRAMUSCULAR | Status: AC
  Administered 2017-06-08: 30 mg via INTRAMUSCULAR

## 2017-06-08 NOTE — Assessment & Plan Note (Signed)
The patient presents today with a blood pressure of 138/88. The patient is currently taking prinzide 20-12.5mg  daily.

## 2017-06-08 NOTE — Progress Notes (Addendum)
   CC: Right knee pain  HPI:  [name] is a 62 y.o. with htn, hld, osteoarthritis who presents for right leg pain. Please see problem based charting for evaluation, assessment, and plan.  Past Medical History:  Diagnosis Date  . Arthritis   . Chest pain   . Health maintenance examination   . Healthcare maintenance   . Hyperlipidemia   . Hypertension   . Low back pain   . Obesity   . Osteoarthritis   . Other screening mammogram   . Scoliosis   . Tendonitis, Achilles, right   . Wrist pain    Review of Systems:   Headaches when she wakes up Denies nausea, acid reflux, sob  Physical Exam:  Vitals:   06/08/17 1132  BP: 138/88  Pulse: 71  Temp: 98.2 F (36.8 C)  TempSrc: Oral  SpO2: 99%  Weight: 217 lb 14.4 oz (98.8 kg)   Physical Exam  Constitutional: She appears well-developed and well-nourished. No distress.  HENT:  Head: Normocephalic and atraumatic.  Eyes: Conjunctivae are normal.  Cardiovascular: Normal rate, regular rhythm, normal heart sounds and intact distal pulses.  No murmur heard. Respiratory: Breath sounds normal. No respiratory distress. She has no wheezes.  GI: Soft. Bowel sounds are normal. She exhibits no distension. There is no tenderness.  Musculoskeletal: She exhibits edema (2+ pitting).  4/5 muscle strength in bilateral lower extremities. Decreased rom in right knee. Has pain with internal and external rotation and with flexion. Laxity cannot be appreciated on drawer test due to patient's body habitus Fluid buildup appreciated on superomedial aspect of right knee.   Skin: She is not diaphoretic.  Psychiatric: She has a normal mood and affect. Her behavior is normal. Judgment and thought content normal.    Assessment & Plan:   See Encounters Tab for problem based charting.  Patient discussed with Dr. Rogelia BogaButcher

## 2017-06-08 NOTE — Progress Notes (Signed)
Internal Medicine Clinic Attending  Case discussed with Dr. Delma Officerhundi at the time of the visit.  We reviewed the resident's history and exam and pertinent patient test results.  I agree with the assessment, diagnosis, and plan of care documented in the resident's note. We also discussed baker's cyst as cause of calf pain but tx would be tx the underlying knee OA. We also thought of DVT as cause but were able to R/O this clinically.

## 2017-06-08 NOTE — Patient Instructions (Addendum)
It was a pleasure to see you today Tiffany Pacheco. Please make the following changes:  -use a knee sleeve that can be bought over the counter -take meloxicam for pain as needed -use heat or ice on knee -try to lose weight to prevent putting weight on knee -follow up for steroid injection in knee  If you have any questions or concerns, please call our clinic at [phone] between 9am-5pm and after hours call [phone] and ask for the internal medicine resident on call. If you feel you are having a medical emergency please call 911.   Thank you, we look forward to help you remain healthy!  Lorenso Courier, MD Internal Medicine PGY1  FOLLOW-UP INSTRUCTIONS When: in 2 weeks For: knee steroid injection What to bring: nothing      Osteoarthritis Osteoarthritis is a type of arthritis that affects tissue that covers the ends of bones in joints (cartilage). Cartilage acts as a cushion between the bones and helps them move smoothly. Osteoarthritis results when cartilage in the joints gets worn down. Osteoarthritis is sometimes called "wear and tear" arthritis. Osteoarthritis is the most common form of arthritis. It often occurs in older people. It is a condition that gets worse over time (a progressive condition). Joints that are most often affected by this condition are in:  Fingers.  Toes.  Hips.  Knees.  Spine, including neck and lower back.  What are the causes? This condition is caused by age-related wearing down of cartilage that covers the ends of bones. What increases the risk? The following factors may make you more likely to develop this condition:  Older age.  Being overweight or obese.  Overuse of joints, such as in athletes.  Past injury of a joint.  Past surgery on a joint.  Family history of osteoarthritis.  What are the signs or symptoms? The main symptoms of this condition are pain, swelling, and stiffness in the joint. The joint may lose its shape over  time. Small pieces of bone or cartilage may break off and float inside of the joint, which may cause more pain and damage to the joint. Small deposits of bone (osteophytes) may grow on the edges of the joint. Other symptoms may include:  A grating or scraping feeling inside the joint when you move it.  Popping or creaking sounds when you move.  Symptoms may affect one or more joints. Osteoarthritis in a major joint, such as your knee or hip, can make it painful to walk or exercise. If you have osteoarthritis in your hands, you might not be able to grip items, twist your hand, or control small movements of your hands and fingers (fine motor skills). How is this diagnosed? This condition may be diagnosed based on:  Your medical history.  A physical exam.  Your symptoms.  X-rays of the affected joint(s).  Blood tests to rule out other types of arthritis.  How is this treated? There is no cure for this condition, but treatment can help to control pain and improve joint function. Treatment plans may include:  A prescribed exercise program that allows for rest and joint relief. You may work with a physical therapist.  A weight control plan.  Pain relief techniques, such as: ? Applying heat and cold to the joint. ? Electric pulses delivered to nerve endings under the skin (transcutaneous electrical nerve stimulation, or TENS). ? Massage. ? Certain nutritional supplements.  NSAIDs or prescription medicines to help relieve pain.  Medicine to help relieve pain and  inflammation (corticosteroids). This can be given by mouth (orally) or as an injection.  Assistive devices, such as a brace, wrap, splint, specialized glove, or cane.  Surgery, such as: ? An osteotomy. This is done to reposition the bones and relieve pain or to remove loose pieces of bone and cartilage. ? Joint replacement surgery. You may need this surgery if you have very bad (advanced) osteoarthritis.  Follow these  instructions at home: Activity  Rest your affected joints as directed by your health care provider.  Do not drive or use heavy machinery while taking prescription pain medicine.  Exercise as directed. Your health care provider or physical therapist may recommend specific types of exercise, such as: ? Strengthening exercises. These are done to strengthen the muscles that support joints that are affected by arthritis. They can be performed with weights or with exercise bands to add resistance. ? Aerobic activities. These are exercises, such as brisk walking or water aerobics, that get your heart pumping. ? Range-of-motion activities. These keep your joints easy to move. ? Balance and agility exercises. Managing pain, stiffness, and swelling  If directed, apply heat to the affected area as often as told by your health care provider. Use the heat source that your health care provider recommends, such as a moist heat pack or a heating pad. ? If you have a removable assistive device, remove it as told by your health care provider. ? Place a towel between your skin and the heat source. If your health care provider tells you to keep the assistive device on while you apply heat, place a towel between the assistive device and the heat source. ? Leave the heat on for 20-30 minutes. ? Remove the heat if your skin turns bright red. This is especially important if you are unable to feel pain, heat, or cold. You may have a greater risk of getting burned.  If directed, put ice on the affected joint: ? If you have a removable assistive device, remove it as told by your health care provider. ? Put ice in a plastic bag. ? Place a towel between your skin and the bag. If your health care provider tells you to keep the assistive device on during icing, place a towel between the assistive device and the bag. ? Leave the ice on for 20 minutes, 2-3 times a day. General instructions  Take over-the-counter and  prescription medicines only as told by your health care provider.  Maintain a healthy weight. Follow instructions from your health care provider for weight control. These may include dietary restrictions.  Do not use any products that contain nicotine or tobacco, such as cigarettes and e-cigarettes. These can delay bone healing. If you need help quitting, ask your health care provider.  Use assistive devices as directed by your health care provider.  Keep all follow-up visits as told by your health care provider. This is important. Where to find more information:  [employer] of Arthritis and Musculoskeletal and Skin Diseases: www.niams.http://www.myers.net/  [employer] on Aging: https://walker.com/  American College of Rheumatology: www.rheumatology.org Contact a health care provider if:  Your skin turns red.  You develop a rash.  You have pain that gets worse.  You have a fever along with joint or muscle aches. Get help right away if:  You lose a lot of weight.  You suddenly lose your appetite.  You have night sweats. Summary  Osteoarthritis is a type of arthritis that affects tissue covering the ends of bones in  joints (cartilage).  This condition is caused by age-related wearing down of cartilage that covers the ends of bones.  The main symptom of this condition is pain, swelling, and stiffness in the joint.  There is no cure for this condition, but treatment can help to control pain and improve joint function. This information is not intended to replace advice given to you by your health care provider. Make sure you discuss any questions you have with your health care provider. Document Released: 04/19/2005 Document Revised: 12/22/2015 Document Reviewed: 12/22/2015 Elsevier Interactive Patient Education  [employer]2018 Elsevier Inc.

## 2017-06-08 NOTE — Assessment & Plan Note (Signed)
The patient has had 2 week history of right knee pain. The patient describes the pain as sharp/stabbing in nature, 9/10 intensity, constantly present, and radiating down calf. The patient notes that the pain is worst in the superiomedial aspect of the right knee. The patient is worse when she walks and the tightness is somewhat alleviated when she rests. The patient does not note any recent trauma. She has used ibuprofen and heating pad which has only helped somewhat.   Per chart review, the patient has a history of bilateral osteoarthritis. The last imaging present in emr is on the left knee in 2011 which showed mild osteoarthritic changes. Right knee imaging has not been done.   -Meloxicam 7.5mg  qd  -Recommended the patient to get good shoes that have good support -Knee sleeve -If the patient continues to have pain then instructed her to make an appointment in 2-3 weeks to get steroid knee injection

## 2017-06-10 ENCOUNTER — Ambulatory Visit: Payer: Medicaid Other

## 2017-06-14 ENCOUNTER — Other Ambulatory Visit: Payer: Self-pay

## 2017-06-14 ENCOUNTER — Ambulatory Visit (INDEPENDENT_AMBULATORY_CARE_PROVIDER_SITE_OTHER): Payer: Medicaid Other | Admitting: Internal Medicine

## 2017-06-14 ENCOUNTER — Encounter: Payer: Self-pay | Admitting: Internal Medicine

## 2017-06-14 VITALS — BP 152/98 | HR 98 | Temp 97.8°F | Ht 61.0 in | Wt 217.5 lb

## 2017-06-14 DIAGNOSIS — I10 Essential (primary) hypertension: Secondary | ICD-10-CM | POA: Diagnosis not present

## 2017-06-14 DIAGNOSIS — M1711 Unilateral primary osteoarthritis, right knee: Secondary | ICD-10-CM

## 2017-06-14 DIAGNOSIS — E785 Hyperlipidemia, unspecified: Secondary | ICD-10-CM

## 2017-06-14 DIAGNOSIS — M170 Bilateral primary osteoarthritis of knee: Secondary | ICD-10-CM

## 2017-06-14 LAB — SYNOVIAL CELL COUNT + DIFF, W/ CRYSTALS
CRYSTALS FLUID: NONE SEEN
Eosinophils-Synovial: 0 % (ref 0–1)
Lymphocytes-Synovial Fld: 69 % — ABNORMAL HIGH (ref 0–20)
Monocyte-Macrophage-Synovial Fluid: 29 % — ABNORMAL LOW (ref 50–90)
Neutrophil, Synovial: 2 % (ref 0–25)
Other Cells-SYN: 0
WBC, SYNOVIAL: 250 /mm3 — AB (ref 0–200)

## 2017-06-14 NOTE — Progress Notes (Signed)
   CC: Right Knee pain  HPI:  [name] is a 62 y.o. with htn, hld, osteoarthritis who presents for right leg pain. Please see problem based charting for evaluation, assessment, and plan.   Past Medical History:  Diagnosis Date  . Arthritis   . Chest pain   . Health maintenance examination   . Healthcare maintenance   . Hyperlipidemia   . Hypertension   . Low back pain   . Obesity   . Osteoarthritis   . Other screening mammogram   . Scoliosis   . Tendonitis, Achilles, right   . Wrist pain    Review of Systems:  Right knee pain  Physical Exam:  Vitals:   06/14/17 1442  BP: (!) 152/98  Pulse: 98  Temp: 97.8 F (36.6 C)  TempSrc: Oral  SpO2: 98%  Weight: 217 lb 8 oz (98.7 kg)  Height: 5\' 1"  (1.549 m)   Physical Exam  Constitutional: She appears well-developed and well-nourished. No distress.  HENT:  Head: Normocephalic and atraumatic.  Eyes: Conjunctivae are normal.  GI: She exhibits no distension.  Musculoskeletal: She exhibits no edema.  Right knee superolateral aspect was mildly swollen and nonerythematous. Effusion could be palpated    Neurological: She is alert.  Skin: She is not diaphoretic. No erythema.  Psychiatric: She has a normal mood and affect. Her behavior is normal. Judgment and thought content normal.    Assessment & Plan:   See Encounters Tab for problem based charting.  Patient seen with Dr. Oswaldo DoneVincent

## 2017-06-14 NOTE — Assessment & Plan Note (Addendum)
The patient presents with right knee pain that is 7/10 intensity, sharp in nature, constantly present, and worsened with movement. She states that her right knee pain is limiting her daily activities and causing her to lose sleep. The patient has applied head to the knee which has helped alleviate the pain somewhat. She was previously seen on 06/08/17 during which time she was instructed to use meloxicam , apply heat, and use a knee sleeve.   -Right knee arthrocentesis and injection was done -Synovial fluid sent for cell count   Knee Arthrocentesis with Injection Procedure Note  Diagnosis: right knee  Indications: Symptom relief from osteoarthritis  Anesthesia: Lidocaine 1% without epinephrine  Procedure Details   Point of care ultrasound was used to identify the joint effusion and plan needle trajectory and depth. Consent was obtained for the procedure. The joint was prepped with Betadine. A 22 gauge needle was inserted into the superior aspect of the joint from a medial approach to access the suprapatellar pouch. 15 ml of clear yellow fluid was removed from the joint and sent to the lab for analysis. 2 ml 1% lidocaine and 1 ml of Triamcinolone was then injected into the joint through the same needle. The needle was removed and the area cleansed and dressed.  Complications:  None; patient tolerated the procedure well.

## 2017-06-14 NOTE — Patient Instructions (Signed)
It was a pleasure to see you today Tiffany Pacheco. During this visit you received a steroid injection to your right knee. Please make the following changes:  -Rest your knee -Apply heat to the right knee -Use ibuprofen/meloxicam as needed  If you have any questions or concerns, please call our clinic at [phone] between 9am-5pm and after hours call [phone] and ask for the internal medicine resident on call. If you feel you are having a medical emergency please call 911.   Thank you, we look forward to help you remain healthy!  Lorenso CourierVahini Ravenne Wayment, MD Internal Medicine PGY1

## 2017-06-15 NOTE — Addendum Note (Signed)
Addended by: Remus BlakeBARROW, Doxie Augenstein K on: 06/15/2017 08:55 AM   Modules accepted: Orders

## 2017-06-15 NOTE — Progress Notes (Signed)
Internal Medicine Clinic Attending  I saw and evaluated the patient.  I personally confirmed the key portions of the history and exam documented by Dr. Delma Officerhundi and I reviewed pertinent patient test results.  The assessment, diagnosis, and plan were formulated together and I agree with the documentation in the resident's note. I was present for the entirety of the procedure.  Patient with likely right moderate right knee OA with increased pain for several weeks not relieved with conservative NSAID trial. Exam otherwise reassuring, no joint laxity, no joint line tenderness, minimal patellar crepitus. As a correction to the procedure note, an 18 gauge needle was used to access the right suprapatellar bursa from the lateral approach. After aspirating 15ml, we injected 40mg  of Triamcinolone.    Short axis view of the lateral suprapatellar bursa with a moderate sized simple free flowing effusion. This was the area that we drained and injected.

## 2017-08-31 ENCOUNTER — Other Ambulatory Visit: Payer: Self-pay | Admitting: *Deleted

## 2017-08-31 DIAGNOSIS — E785 Hyperlipidemia, unspecified: Secondary | ICD-10-CM

## 2017-08-31 DIAGNOSIS — I10 Essential (primary) hypertension: Secondary | ICD-10-CM

## 2017-08-31 DIAGNOSIS — J309 Allergic rhinitis, unspecified: Secondary | ICD-10-CM

## 2017-09-01 MED ORDER — LISINOPRIL-HYDROCHLOROTHIAZIDE 20-12.5 MG PO TABS: 1.0000 | ORAL_TABLET | Freq: Every day | ORAL | 3 refills | Status: DC

## 2017-09-01 MED ORDER — LORATADINE 10 MG PO TABS: 10.0000 mg | ORAL_TABLET | Freq: Every day | ORAL | 11 refills | Status: DC

## 2017-09-01 MED ORDER — ATORVASTATIN CALCIUM 40 MG PO TABS: 40.0000 mg | ORAL_TABLET | Freq: Every day | ORAL | 3 refills | Status: DC

## 2017-09-01 NOTE — Telephone Encounter (Signed)
Patient will need an appointment with his PCP in August.  I will refill her lovastatin, loratadine, and Prinzide as time.  Lanelle Bal, MD Internal Medicine, PGY-1 Pager # [phone]

## 2017-09-01 NOTE — Telephone Encounter (Signed)
Please schedule an appt w/Dr Crista Elliot in August. Thanks

## 2017-09-02 NOTE — Telephone Encounter (Signed)
Appt with PCP 12/07/2017.

## 2017-09-26 ENCOUNTER — Encounter: Payer: Self-pay | Admitting: *Deleted

## 2017-11-23 ENCOUNTER — Other Ambulatory Visit: Payer: Self-pay | Admitting: Internal Medicine

## 2017-11-23 DIAGNOSIS — E785 Hyperlipidemia, unspecified: Secondary | ICD-10-CM

## 2017-11-23 DIAGNOSIS — M158 Other polyosteoarthritis: Secondary | ICD-10-CM

## 2017-11-23 DIAGNOSIS — I10 Essential (primary) hypertension: Secondary | ICD-10-CM

## 2017-11-23 NOTE — Telephone Encounter (Signed)
Refill Request   Medications        atorvastatin (LIPITOR) 40 MG tablet    diclofenac sodium (VOLTAREN) 1 % GEL        lisinopril-hydrochlorothiazide (PRINZIDE,ZESTORETIC) 20-12.5 MG tablet            ranitidine (ZANTAC) 150 MG tablet(Expired)

## 2017-11-24 MED ORDER — ATORVASTATIN CALCIUM 40 MG PO TABS: 40.0000 mg | ORAL_TABLET | Freq: Every day | ORAL | 0 refills | Status: DC

## 2017-11-24 MED ORDER — RANITIDINE HCL 150 MG PO TABS: 150.0000 mg | ORAL_TABLET | Freq: Two times a day (BID) | ORAL | 0 refills | Status: DC

## 2017-11-24 MED ORDER — LISINOPRIL-HYDROCHLOROTHIAZIDE 20-12.5 MG PO TABS
1.0000 | ORAL_TABLET | Freq: Every day | ORAL | 0 refills | Status: DC
Start: 2017-11-24 — End: 2018-02-27

## 2017-11-24 NOTE — Telephone Encounter (Signed)
These refill requests were approved by PCP today. Kinnie FeilL. Priscilla Finklea, RN, BSN

## 2017-12-07 ENCOUNTER — Encounter: Payer: Medicaid Other | Admitting: Internal Medicine

## 2018-01-04 ENCOUNTER — Encounter: Payer: Medicaid Other | Admitting: Internal Medicine

## 2018-01-25 ENCOUNTER — Encounter: Payer: Medicaid Other | Admitting: Internal Medicine

## 2018-02-22 ENCOUNTER — Encounter: Payer: Self-pay | Admitting: Internal Medicine

## 2018-02-22 ENCOUNTER — Encounter: Payer: Medicaid Other | Admitting: Internal Medicine

## 2018-02-22 NOTE — Progress Notes (Deleted)
   CC: Routine visit for HTN, and healthcare maintenance   HPI:Ms.KARALEE HAUTER is a 62 y.o. female who presents for evaluation of HTN and healthcare maintenance. Please see individual problem based A/P for details.  HTN: *** Plan: ***  Need for influenza vaccination:  Need for colon cancer screening:  Need for breat cancer screening:  PHQ-9: Based on the patients  score we have ***.  Past Medical History:  Diagnosis Date  . Arthritis   . Chest pain   . Health maintenance examination   . Healthcare maintenance   . Hyperlipidemia   . Hypertension   . Low back pain   . Obesity   . Osteoarthritis   . Other screening mammogram   . Scoliosis   . Tendonitis, Achilles, right   . Wrist pain    Review of Systems:  ROS negative except as per HPI.  Physical Exam: There were no vitals filed for this visit. General: *** HEENT: *** Cardio: *** Pulmonary: *** MSK: ***  Assessment & Plan:   See Encounters Tab for problem based charting.  Patient {GC/GE:3044014::"discussed with","seen with"} Dr. {NAMES:[id number]::"Butcher","Granfortuna","E. Hoffman","Klima","Mullen","Narendra","Raines","Vincent"}

## 2018-02-27 ENCOUNTER — Other Ambulatory Visit: Payer: Self-pay | Admitting: Internal Medicine

## 2018-02-27 DIAGNOSIS — E785 Hyperlipidemia, unspecified: Secondary | ICD-10-CM

## 2018-02-27 DIAGNOSIS — K219 Gastro-esophageal reflux disease without esophagitis: Secondary | ICD-10-CM

## 2018-02-27 DIAGNOSIS — I10 Essential (primary) hypertension: Secondary | ICD-10-CM

## 2018-03-08 ENCOUNTER — Ambulatory Visit: Payer: Medicaid Other | Admitting: Internal Medicine

## 2018-03-08 ENCOUNTER — Encounter: Payer: Self-pay | Admitting: Internal Medicine

## 2018-03-08 VITALS — BP 129/89 | HR 93 | Temp 98.2°F | Wt 221.1 lb

## 2018-03-08 DIAGNOSIS — Z1211 Encounter for screening for malignant neoplasm of colon: Secondary | ICD-10-CM

## 2018-03-08 DIAGNOSIS — Z2821 Immunization not carried out because of patient refusal: Secondary | ICD-10-CM

## 2018-03-08 DIAGNOSIS — I10 Essential (primary) hypertension: Secondary | ICD-10-CM

## 2018-03-08 DIAGNOSIS — H02843 Edema of right eye, unspecified eyelid: Secondary | ICD-10-CM | POA: Insufficient documentation

## 2018-03-08 DIAGNOSIS — Z1239 Encounter for other screening for malignant neoplasm of breast: Secondary | ICD-10-CM

## 2018-03-08 DIAGNOSIS — H02841 Edema of right upper eyelid: Secondary | ICD-10-CM

## 2018-03-08 DIAGNOSIS — Z79899 Other long term (current) drug therapy: Secondary | ICD-10-CM

## 2018-03-08 NOTE — Progress Notes (Signed)
   CC: Routine visit for HTN and right eye swelling  HPI:[name] is a 62 y.o. female who presents for evaluation of HTN and right eye swelling. Please see individual problem based A/P for details.  HTN: Patients BP improved today at 129/89. Although below 130/90 I would prefer that she be under 130/80 at her age and with her risk factors. However, as this is our first meeting we have agreed to defer medication changes and repeat her pressure at her next visit. Plan: Continue lisinopril-HCTZ 20-1.5mg  combo tablet Consider adding Amlodipine 5mg  at her next visit.   Right eye swollen: Sat up this morning and noticed swelling of her right eye. Her eye felt "funny", was red, swollen without drainage, no pain, no vision changes, just feels tight above the eye in the upper eyelid. Her current symptoms are possibly consistent with phlebitis but are not classic for such. Also I do not feel that this is a bacterial infection at this stage although it is unilateral given the history and exam findings. Given her history of similar symptoms with aggressive rubbing of the upper eyelid in the past it is likely that she aggravated the area while sleeping.   Plan:  She will continue warm compresses and notify us if she develops pain, drainage, redness of the eye, or other concerning symptoms.  Would recommend topical antibiotic treatment if symptoms develop further or worsen.   Health Maintenance: Colonoscopy?-Refused but FIT testing ordered Mammogram?-Ordered Pap Smear?-Will reschedule for PAP only visit Flu?-refused, discussed need and benefit, patient again refused.   PHQ-9: Based on the patients    [facility] Visit from 03/08/2018 in [facility]  PHQ-9 Total Score  0     score we have decided to continue routine screening.  Past Medical History:  Diagnosis Date  . Arthritis   . Chest pain   . Health maintenance examination   . Healthcare maintenance   .  Hyperlipidemia   . Hypertension   . Low back pain   . Obesity   . Osteoarthritis   . Other screening mammogram   . Scoliosis   . Tendonitis, Achilles, right   . Wrist pain    Review of Systems:  ROS negative except as per HPI.  Physical Exam: Vitals:   03/08/18 1357  BP: 129/89  Pulse: 93  Temp: 98.2 F (36.8 C)  TempSrc: Oral  SpO2: 97%  Weight: 221 lb 1.6 oz (100.3 kg)   General: A/O x4, in no acute distress, afebrile, nondiaphoretic HEENT: Eye-visual acuity unchanged equal to left eye, not injected, no drainage, no lesion chalazion or stye. Upper, not lower eyelid is mildly edematous, not erythematous or painful to palpation.  Cardio: RRR, no mrg's Pulmonary: CTA bilaterally  Assessment & Plan:   See Encounters Tab for problem based charting.  Patient discussed with Dr. Criselda Peaches

## 2018-03-08 NOTE — Assessment & Plan Note (Signed)
Right eye swollen: Sat up this morning and noticed swelling of her right eye. Her eye felt "funny", was red, swollen without drainage, no pain, no vision changes, just feels tight above the eye in the upper eyelid. Her current symptoms are possibly consistent with phlebitis but are not classic for such. Also I do not feel that this is a bacterial infection at this stage although it is unilateral given the history and exam findings. Given her history of similar symptoms with aggressive rubbing of the upper eyelid in the past it is likely that she aggravated the area while sleeping.   Plan:  She will continue warm compresses and notify us if she develops pain, drainage, redness of the eye, or other concerning symptoms.  Would recommend topical antibiotic treatment if symptoms develop further or worsen.

## 2018-03-08 NOTE — Assessment & Plan Note (Signed)
HTN: Patients BP improved today at 129/89. Although below 130/90 I would prefer that she be under 130/80 at her age and with her risk factors. However, as this is our first meeting we have agreed to defer medication changes and repeat her pressure at her next visit. Plan: Continue lisinopril-HCTZ 20-1.5mg  combo tablet Consider adding Amlodipine 5mg  at her next visit.

## 2018-03-08 NOTE — Patient Instructions (Addendum)
FOLLOW-UP INSTRUCTIONS When: 6 months for a routine visit and please stop at the front desk and schedule a PAP exam visit only with me.  What to bring: All of your medications  I have not made any change to your medications today.  Today we discussed your right eye swelling, health screening needs and high blood pressure. Your blood pressure is improved today but the lower number is a little high. Please continue to take your blood pressure medicine and we will continue to monitor this.  With regard to your right eye, I feel that watching it for now is ideal as there are no signs of a severe injury or illness. Please continue to use warm compresses and if you develop pain, swelling of the eye, pus or eye drainage please notify us on Friday and return to have it looked at.   I will notify you of the results of any labs from today's evaluation when available to me.   Thank you for your visit to the [facility] today. If you have any questions or concerns please call us at [phone].

## 2018-03-10 NOTE — Progress Notes (Signed)
Internal Medicine Clinic Attending  Case discussed with Dr. Harbrecht at the time of the visit.  We reviewed the resident's history and exam and pertinent patient test results.  I agree with the assessment, diagnosis, and plan of care documented in the resident's note.   

## 2018-05-10 ENCOUNTER — Other Ambulatory Visit: Payer: Self-pay | Admitting: Internal Medicine

## 2018-05-10 DIAGNOSIS — K219 Gastro-esophageal reflux disease without esophagitis: Secondary | ICD-10-CM

## 2018-05-10 DIAGNOSIS — I10 Essential (primary) hypertension: Secondary | ICD-10-CM

## 2018-05-17 ENCOUNTER — Encounter: Payer: Self-pay | Admitting: Internal Medicine

## 2018-05-29 ENCOUNTER — Encounter (HOSPITAL_COMMUNITY): Payer: Self-pay

## 2018-05-29 ENCOUNTER — Other Ambulatory Visit: Payer: Self-pay

## 2018-05-29 ENCOUNTER — Emergency Department (HOSPITAL_COMMUNITY)
Admission: EM | Admit: 2018-05-29 | Discharge: 2018-05-29 | Disposition: A | Discharge status: Home / Self Care | Payer: Medicaid Other | Attending: Emergency Medicine | Admitting: Emergency Medicine

## 2018-05-29 DIAGNOSIS — I10 Essential (primary) hypertension: Secondary | ICD-10-CM | POA: Insufficient documentation

## 2018-05-29 DIAGNOSIS — M7661 Achilles tendinitis, right leg: Secondary | ICD-10-CM | POA: Insufficient documentation

## 2018-05-29 DIAGNOSIS — Z87891 Personal history of nicotine dependence: Secondary | ICD-10-CM | POA: Diagnosis not present

## 2018-05-29 DIAGNOSIS — Z79899 Other long term (current) drug therapy: Secondary | ICD-10-CM | POA: Diagnosis not present

## 2018-05-29 DIAGNOSIS — M25571 Pain in right ankle and joints of right foot: Secondary | ICD-10-CM | POA: Diagnosis present

## 2018-05-29 MED ORDER — ACETAMINOPHEN 500 MG PO TABS
1000.0000 mg | ORAL_TABLET | Freq: Once | ORAL | Status: AC
  Administered 2018-05-29: 1000 mg via ORAL
  Filled 2018-05-29: qty 2

## 2018-05-29 MED ORDER — IBUPROFEN 800 MG PO TABS
800.0000 mg | ORAL_TABLET | Freq: Once | ORAL | Status: AC
  Administered 2018-05-29: 800 mg via ORAL
  Filled 2018-05-29: qty 1

## 2018-05-29 MED ORDER — DICLOFENAC SODIUM 1 % TD GEL: 4.0000 g | Freq: Four times a day (QID) | TRANSDERMAL | 0 refills | Status: DC

## 2018-05-29 NOTE — ED Triage Notes (Signed)
Pt here for right ankle pain for the past 2 weeks with swelling. Pt denies any injury or recent falls.

## 2018-05-29 NOTE — ED Provider Notes (Signed)
[facility] Provider Note   CSN: [id number] Arrival date & time: 05/29/18  1109     History   Chief Complaint Chief Complaint  Patient presents with  . Ankle Pain    HPI Tiffany Pacheco is a 63 y.o. female.  63 yo F with a chief complaint of right ankle pain.  Going on for the past 2 days.  Worse with bearing weight and palpation.  She denies trauma.  Has really tried thing for this at home.  Denies redness or fevers or chills.  Denies break in the skin.  The history is provided by the patient.  Ankle Pain  Location:  Ankle Time since incident:  1 week Injury: no   Ankle location:  R ankle Pain details:    Quality:  Sharp   Radiates to:  Does not radiate   Severity:  Moderate   Onset quality:  Sudden   Duration:  1 week   Timing:  Constant   Progression:  Unchanged Chronicity:  New Dislocation: no   Foreign body present:  No foreign bodies Prior injury to area:  No Relieved by:  Rest Worsened by:  Bearing weight Ineffective treatments:  None tried Associated symptoms: no fever and no swelling     Past Medical History:  Diagnosis Date  . Arthritis   . Chest pain   . Health maintenance examination   . Healthcare maintenance   . Hyperlipidemia   . Hypertension   . Low back pain   . Obesity   . Osteoarthritis   . Other screening mammogram   . Scoliosis   . Tendonitis, Achilles, right   . Wrist pain     Patient Active Problem List   Diagnosis Date Noted  . Edema of right eyelid 03/08/2018  . Hyperglycemia 09/20/2016  . Obesity 04/04/2013  . Insomnia 12/20/2012  . Chronic allergic rhinitis 03/20/2012  . GERD (gastroesophageal reflux disease) 07/19/2011  . CMC arthritis, thumb, degenerative 03/29/2011  . Hyperlipidemia 01/26/2011  . Preventative health care 01/26/2011  . Low back pain 01/08/2011  . Hypertension 11/25/2010  . Scoliosis 11/25/2010  . Osteoarthritis 11/25/2010    Past Surgical History:    Procedure Laterality Date  . BREAST LUMPECTOMY    . TUBAL LIGATION       OB History   No obstetric history on file.      Home Medications    Prior to Admission medications   Medication Sig Start Date End Date Taking? Authorizing Provider  acetaminophen (TYLENOL) 325 MG tablet Take 1-2 tablets (325-650 mg total) by mouth every 8 (eight) hours as needed. Patient not taking: Reported on 04/20/2016 02/27/14   Dow AdolphKazibwe, Richard, MD  atorvastatin (LIPITOR) 40 MG tablet TAKE 1 TABLET (40 MG TOTAL) BY MOUTH DAILY. 02/28/18   Lanelle BalHarbrecht, Lawrence, MD  diclofenac sodium (VOLTAREN) 1 % GEL Apply 4 g topically 4 (four) times daily. 05/29/18   Melene PlanFloyd, Amey Hossain, DO  fluticasone (FLONASE) 50 MCG/ACT nasal spray Place 1 spray into both nostrils daily. 06/02/16   Deneise LeverSaraiya, Parth, MD  lisinopril-hydrochlorothiazide (PRINZIDE,ZESTORETIC) 20-12.5 MG tablet TAKE 1 TABLET BY MOUTH DAILY. 05/11/18   Lanelle BalHarbrecht, Lawrence, MD  loratadine (CLARITIN) 10 MG tablet Take 1 tablet (10 mg total) by mouth daily. 09/01/17   Lanelle BalHarbrecht, Lawrence, MD  ondansetron (ZOFRAN ODT) 4 MG disintegrating tablet Take 1 tablet (4 mg total) by mouth every 8 (eight) hours as needed for nausea or vomiting. Patient not taking: Reported on 04/20/2016 07/28/15   Moody BruinsIrick,  Nils Pyle, MD  ranitidine (ZANTAC) 150 MG tablet TAKE 1 TABLET (150 MG TOTAL) BY MOUTH 2 (TWO) TIMES DAILY. 05/11/18 05/11/19  Lanelle Bal, MD  famotidine (PEPCID) 20 MG tablet Take 20 mg by mouth daily.    07/19/11  [provider]    Family History Family History  Problem Relation Age of Onset  . Diabetes Mother   . Hypertension Mother   . Cancer Cousin     Social History Social History   Tobacco Use  . Smoking status: Former Smoker    Packs/day: 0.50    Years: 2.00    Pack years: 1.00    Types: Cigarettes    Last attempt to quit: 11/24/1988    Years since quitting: 29.5  . Smokeless tobacco: Never Used  Substance Use Topics  . Alcohol use: No  . Drug  use: No     Allergies   Codeine and Fish-derived products   Review of Systems Review of Systems  Constitutional: Negative for chills and fever.  HENT: Negative for congestion and rhinorrhea.   Eyes: Negative for redness and visual disturbance.  Respiratory: Negative for shortness of breath and wheezing.   Cardiovascular: Negative for chest pain and palpitations.  Gastrointestinal: Negative for nausea and vomiting.  Genitourinary: Negative for dysuria and urgency.  Musculoskeletal: Positive for arthralgias. Negative for myalgias.  Skin: Negative for pallor and wound.  Neurological: Negative for dizziness and headaches.     Physical Exam Updated Vital Signs BP (!) 118/95 (BP Location: Right Arm)   Pulse 78   Temp 98 F (36.7 C) (Oral)   Resp 18   Ht 5\' 1"  (1.549 m)   Wt 98.4 kg   SpO2 97%   BMI 41.00 kg/m   Physical Exam Vitals signs and nursing note reviewed.  Constitutional:      General: She is not in acute distress.    Appearance: She is well-developed. She is not diaphoretic.  HENT:     Head: Normocephalic and atraumatic.  Eyes:     Pupils: Pupils are equal, round, and reactive to light.  Neck:     Musculoskeletal: Normal range of motion and neck supple.  Cardiovascular:     Rate and Rhythm: Normal rate and regular rhythm.     Heart sounds: No murmur. No friction rub. No gallop.   Pulmonary:     Effort: Pulmonary effort is normal.     Breath sounds: No wheezing or rales.  Abdominal:     General: There is no distension.     Palpations: Abdomen is soft.     Tenderness: There is no abdominal tenderness.  Musculoskeletal:        General: No tenderness.     Comments: Pain at the attachment of the Achilles tendon into the calcaneus.  Thompson test with flexion of the foot.  Pulse motor and sensation is intact distally.  No pain at the medial or lateral malleolus no pain at the base of the fifth metatarsal or the navicular.  Skin:    General: Skin is warm and  dry.  Neurological:     Mental Status: She is alert and oriented to person, place, and time.  Psychiatric:        Behavior: Behavior normal.      ED Treatments / Results  Labs (all labs ordered are listed, but only abnormal results are displayed) Labs Reviewed - No data to display  EKG None  Radiology No results found.  Procedures Procedures (including critical care  time)  Medications Ordered in ED Medications  acetaminophen (TYLENOL) tablet 1,000 mg (has no administration in time range)  ibuprofen (ADVIL,MOTRIN) tablet 800 mg (has no administration in time range)     Initial Impression / Assessment and Plan / ED Course  I have reviewed the triage vital signs and the nursing notes.  Pertinent labs & imaging results that were available during my care of the patient were reviewed by me and considered in my medical decision making (see chart for details).     63 yo F with a chief complaint of a posterior ankle pain.  Most likely this is Achilles tendinitis.  Patient is wearing a house slipper, I discussed Appropriate footwear.  We will put her in a cam walker.  Tylenol and diclofenac gel.  PCP follow-up.  11:36 AM:  I have discussed the diagnosis/risks/treatment options with the patient and family and believe the pt to be eligible for discharge home to follow-up with PCP. We also discussed returning to the ED immediately if new or worsening sx occur. We discussed the sx which are most concerning (e.g., sudden worsening pain, fever, inability to tolerate by mouth) that necessitate immediate return. Medications administered to the patient during their visit and any new prescriptions provided to the patient are listed below.  Medications given during this visit Medications  acetaminophen (TYLENOL) tablet 1,000 mg (has no administration in time range)  ibuprofen (ADVIL,MOTRIN) tablet 800 mg (has no administration in time range)     The patient appears reasonably screen  and/or stabilized for discharge and I doubt any other medical condition or other [facility] requiring further screening, evaluation, or treatment in the ED at this time prior to discharge.    Final Clinical Impressions(s) / ED Diagnoses   Final diagnoses:  Achilles tendinitis, right leg    ED Discharge Orders         Ordered    diclofenac sodium (VOLTAREN) 1 % GEL  4 times daily     05/29/18 1133           ColonyFloyd, Jacorey Donaway, DO 05/29/18 1136

## 2018-05-29 NOTE — Discharge Instructions (Signed)
Use the cam walker for at least the next week.  Return for redness, fever, inability to walk

## 2018-06-27 NOTE — Progress Notes (Deleted)
Void  

## 2018-06-28 ENCOUNTER — Encounter: Payer: Medicaid Other | Admitting: Internal Medicine

## 2018-06-29 ENCOUNTER — Encounter: Payer: Self-pay | Admitting: Internal Medicine

## 2018-07-31 ENCOUNTER — Other Ambulatory Visit: Payer: Self-pay

## 2018-07-31 DIAGNOSIS — J309 Allergic rhinitis, unspecified: Secondary | ICD-10-CM

## 2018-07-31 NOTE — Telephone Encounter (Signed)
loratadine (CLARITIN) 10 MG tablet, refill request @  [facility] - [place], [place] - [address] [phone] (Phone) [phone] (Fax)

## 2018-08-01 ENCOUNTER — Other Ambulatory Visit: Payer: Self-pay | Admitting: Internal Medicine

## 2018-08-01 DIAGNOSIS — K219 Gastro-esophageal reflux disease without esophagitis: Secondary | ICD-10-CM

## 2018-08-04 MED ORDER — LORATADINE 10 MG PO TABS: 10.0000 mg | ORAL_TABLET | Freq: Every day | ORAL | 11 refills | Status: DC

## 2018-08-24 ENCOUNTER — Telehealth: Payer: Self-pay | Admitting: *Deleted

## 2018-08-24 NOTE — Telephone Encounter (Signed)
Notified on 08/21/2018 that CMN for incontinence supplies received for Dr. Evelene Croon. Patient has never seen Dr. Evelene Croon. PCP unable to complete form as incontinence has never been addressed. Call placed to patient. States she saw an ad on TV and called the number. States she has been having increasing loss of bladder control and didn't want to make an appt 2/2 Covid-19. Patient has telehealth visit scheduled on 08/30/2018 at 3:15. Explained to patient that this would need to be addressed at that visit in order for insurance to cover supplies. 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  Patient is in agreement. Will place order form in PCP's box to be completed after phone visit on 08/30/2018. Patient very appreciative. Kinnie Feil, RN, BSN

## 2018-08-26 NOTE — Telephone Encounter (Signed)
That will be perfect.  Thank you!

## 2018-08-30 ENCOUNTER — Ambulatory Visit (INDEPENDENT_AMBULATORY_CARE_PROVIDER_SITE_OTHER): Payer: Medicaid Other | Admitting: Internal Medicine

## 2018-08-30 ENCOUNTER — Other Ambulatory Visit: Payer: Self-pay

## 2018-08-30 ENCOUNTER — Encounter: Payer: Self-pay | Admitting: Internal Medicine

## 2018-08-30 DIAGNOSIS — N3941 Urge incontinence: Secondary | ICD-10-CM | POA: Diagnosis not present

## 2018-08-30 DIAGNOSIS — I10 Essential (primary) hypertension: Secondary | ICD-10-CM

## 2018-08-30 DIAGNOSIS — E785 Hyperlipidemia, unspecified: Secondary | ICD-10-CM | POA: Diagnosis not present

## 2018-08-30 MED ORDER — ATORVASTATIN CALCIUM 40 MG PO TABS: 40.0000 mg | ORAL_TABLET | Freq: Every day | ORAL | 0 refills | Status: DC

## 2018-08-30 NOTE — Assessment & Plan Note (Addendum)
Continue atorvastatin. Will need to consider Lipid profile at next visit as her most recent was in 2017 with an elevated LDL and total.

## 2018-08-30 NOTE — Progress Notes (Signed)
Internal Medicine Clinic Attending  Case discussed with Dr. Harbrecht at the time of the visit.  We reviewed the resident's history and exam and pertinent patient test results.  I agree with the assessment, diagnosis, and plan of care documented in the resident's note.   

## 2018-08-30 NOTE — Progress Notes (Signed)
  [facility] Internal Medicine Residency Telephone Encounter Continuity Care Appointment  HPI:   This telephone encounter was created for [name] on 08/30/2018 for the following purpose of a continuity clinic visit.   Past Medical History:  Past Medical History:  Diagnosis Date  . Arthritis   . Chest pain   . Health maintenance examination   . Healthcare maintenance   . Hyperlipidemia   . Hypertension   . Low back pain   . Obesity   . Osteoarthritis   . Other screening mammogram   . Scoliosis   . Tendonitis, Achilles, right   . Wrist pain       ROS:   ROS negative except as per HPI.   Assessment / Plan / Recommendations:   Please see A&P under problem oriented charting for assessment of the patient's acute and chronic medical conditions.   As always, pt is advised that if symptoms worsen or new symptoms arise, they should go to an urgent care facility or to to ER for further evaluation.   Consent and Medical Decision Making:   Patient discussed with Dr. Heide Pacheco  This is a telephone encounter between Tiffany Pacheco and Tiffany Pacheco on 08/30/2018 for HTN and urinary incontinence. The visit was conducted with the patient located at home and [employer] at [facility]. The patient's identity was confirmed using their DOB and current address. The patient has consented to being evaluated through a telephone encounter and understands the associated risks (an examination cannot be done and the patient may need to come in for an appointment) / benefits (allows the patient to remain at home, decreasing exposure to coronavirus). I personally spent 13 minutes on medical discussion.

## 2018-08-30 NOTE — Assessment & Plan Note (Signed)
Hypertension: Patient's BP was unable to be determined. Goal of <140/80 remains. The patient endorses adherence to her medication regimen. She denied, chest pain, headache, visual changes, lightheadedness, weakness, dizziness on standing, swelling in the feet or ankles.   Plan: Continue lisinopril/HCTZ 20/12.5 mg combo tablet daily BMP at next visit

## 2018-08-30 NOTE — Assessment & Plan Note (Signed)
  Urinary Incontinence: Progressively worsening urinary incontinence over the past several months. Most notably during the day and on occasion at night? Frequency is near 4-5 times per day when she tries to avoid drinking water to decrease the rate? Urge is increased, described as an issue when she feels she needs to urinate she has only moments before it is too late. It is less commonly associated with coughing or sneezing but has on occasion been related.   Plan: Unable to perform physical exam.  Recommend pelvic floor exercises with Kegel most notably.   Will complete incontinence supply form

## 2018-08-31 DIAGNOSIS — R32 Unspecified urinary incontinence: Secondary | ICD-10-CM | POA: Diagnosis not present

## 2018-08-31 NOTE — Telephone Encounter (Signed)
Spoke with Anise Salvo at [employer]. States incontinence supplies were shipped to patient this AM. Received faxed CMN for incontinence supplies from [employer]. Placed in PCP's box for signature on 2 pages. Kinnie Feil, RN, BSN

## 2018-09-04 DIAGNOSIS — R32 Unspecified urinary incontinence: Secondary | ICD-10-CM | POA: Diagnosis not present

## 2018-09-05 NOTE — Telephone Encounter (Signed)
CMN signed by Attending and faxed back to [employer]. Kinnie Feil, RN, BSN

## 2018-09-18 DIAGNOSIS — R32 Unspecified urinary incontinence: Secondary | ICD-10-CM | POA: Diagnosis not present

## 2018-09-29 ENCOUNTER — Other Ambulatory Visit: Payer: Self-pay | Admitting: Internal Medicine

## 2018-09-29 DIAGNOSIS — E785 Hyperlipidemia, unspecified: Secondary | ICD-10-CM

## 2018-10-30 DIAGNOSIS — R32 Unspecified urinary incontinence: Secondary | ICD-10-CM | POA: Diagnosis not present

## 2018-10-31 ENCOUNTER — Other Ambulatory Visit: Payer: Self-pay | Admitting: Internal Medicine

## 2018-10-31 DIAGNOSIS — E785 Hyperlipidemia, unspecified: Secondary | ICD-10-CM

## 2018-12-07 DIAGNOSIS — R32 Unspecified urinary incontinence: Secondary | ICD-10-CM | POA: Diagnosis not present

## 2019-02-12 DIAGNOSIS — R32 Unspecified urinary incontinence: Secondary | ICD-10-CM | POA: Diagnosis not present

## 2019-03-05 DIAGNOSIS — R32 Unspecified urinary incontinence: Secondary | ICD-10-CM | POA: Diagnosis not present

## 2019-03-22 ENCOUNTER — Other Ambulatory Visit: Payer: Self-pay

## 2019-03-22 ENCOUNTER — Ambulatory Visit: Payer: Medicaid Other | Admitting: Internal Medicine

## 2019-03-22 ENCOUNTER — Encounter: Payer: Self-pay | Admitting: Internal Medicine

## 2019-03-22 VITALS — BP 148/94 | HR 94 | Temp 98.5°F | Ht 61.0 in | Wt 216.5 lb

## 2019-03-22 DIAGNOSIS — E785 Hyperlipidemia, unspecified: Secondary | ICD-10-CM | POA: Diagnosis not present

## 2019-03-22 DIAGNOSIS — R42 Dizziness and giddiness: Secondary | ICD-10-CM | POA: Insufficient documentation

## 2019-03-22 DIAGNOSIS — Z124 Encounter for screening for malignant neoplasm of cervix: Secondary | ICD-10-CM | POA: Insufficient documentation

## 2019-03-22 DIAGNOSIS — I10 Essential (primary) hypertension: Secondary | ICD-10-CM

## 2019-03-22 DIAGNOSIS — N3941 Urge incontinence: Secondary | ICD-10-CM

## 2019-03-22 DIAGNOSIS — Z79899 Other long term (current) drug therapy: Secondary | ICD-10-CM

## 2019-03-22 DIAGNOSIS — K219 Gastro-esophageal reflux disease without esophagitis: Secondary | ICD-10-CM

## 2019-03-22 DIAGNOSIS — Z1211 Encounter for screening for malignant neoplasm of colon: Secondary | ICD-10-CM

## 2019-03-22 DIAGNOSIS — Z1231 Encounter for screening mammogram for malignant neoplasm of breast: Secondary | ICD-10-CM | POA: Insufficient documentation

## 2019-03-22 MED ORDER — AMLODIPINE BESYLATE 5 MG PO TABS: 5.0000 mg | ORAL_TABLET | Freq: Every day | ORAL | 1 refills | Status: DC

## 2019-03-22 MED ORDER — PANTOPRAZOLE SODIUM 20 MG PO TBEC: 20.0000 mg | DELAYED_RELEASE_TABLET | Freq: Every day | ORAL | 1 refills | Status: DC

## 2019-03-22 MED ORDER — LISINOPRIL 20 MG PO TABS: 20.0000 mg | ORAL_TABLET | Freq: Every day | ORAL | 1 refills | Status: DC

## 2019-03-22 MED ORDER — ATORVASTATIN CALCIUM 40 MG PO TABS: 40.0000 mg | ORAL_TABLET | Freq: Every day | ORAL | 1 refills | Status: DC

## 2019-03-22 NOTE — Patient Instructions (Signed)
FOLLOW-UP INSTRUCTIONS When: Early February For: Routine visit and pap at the same visit What to bring: All of your medications   For your high blood pressure I have stopped the lisinopril hydrochlorothiazide combo tablet due to your urinary incontinence.  Instead of this, please take lisinopril 20 mg daily and amlodipine 5 mg daily.  We urge incontinence, please continue to use the incontinence supplies and begin the bladder training with a handout that I have given you.  I will obtain labs today as well.  You will need to return in early February for repeat lab check.  Additionally, please stop and schedule your next appointment for a Pap and routine visit in early February.  Please collect the kit for the colon cancer screening and return this within 24 to 48 hours of obtaining the sample.  Please continue to consider the influenza vaccination as a believe this is very important this year.  Please call the [facility] for your mammogram breast cancer screening.  I will notify you of the test results from today's evaluation when available to me.   Thank you for your visit to the [facility] today. If you have any questions or concerns please call us at [phone].

## 2019-03-22 NOTE — Assessment & Plan Note (Signed)
Dizziness: Episode of room spinning or vertigo-like sensation that began on Friday afternoon last until Monday morning.  This is worse with standing, relieved with sitting and lying flat.  This was preceded by a likely viral URI.  She has irritation and drainage of the left ear.  She denies ataxia, dysarthria, dyslexia, focal weakness or other strokelike symptoms.  This lasted for more than 24 hours.  This likely represents vestibulitis or viral illness.  She has mild to moderate risk factors for stroke, she is not smoked over 40 years, does not have diabetes.  She is currently on a statin and blood pressure is at goal.  Low probability of TIA versus CVA.  Symptoms have entirely resolved  Plan:  Continue current therapy patient given strict return precautions and if symptoms again occur I would advise an MRI of the brain.

## 2019-03-22 NOTE — Assessment & Plan Note (Signed)
Hypertension: Patient's BP today is 137/86 with a goal of <140/80. The patient endorses adherence to her medication regimen.  She denied, chest pain, headache, visual changes, lightheadedness, weakness, dizziness on standing, swelling in the feet or ankles (with exception of the episode that began on Friday and then on Monday morning).  We will start amlodipine 5 mg daily and stop the HCTZ given her urge incontinence and mild hypokalemia on her most recent BMP  Plan: Continue lisinopril 20 mg daily Start amlodipine 5 mg daily Stop hydrochlorothiazide BMP today and repeat in 6 to 8 weeks

## 2019-03-22 NOTE — Assessment & Plan Note (Signed)
Need for colon cancer screening: Patient has chosen stool testing. Order placed for FIT.

## 2019-03-22 NOTE — Assessment & Plan Note (Signed)
  Need for breast cancer screening: Screening mammogram ordered.  Patient agreed to have this completed.

## 2019-03-22 NOTE — Assessment & Plan Note (Addendum)
Continue atorvastatin.  Will obtain lipid panel and determine if additional treatment is indicated.  Continue dietary modifications.

## 2019-03-22 NOTE — Progress Notes (Signed)
   CC: Routine f/up for HTN   HPI:[name] is a 63 y.o. female who presents for evaluation of HTN. Please see individual problem based A/P for details.  Need for Influenza vaccination: The patient has turned down the influenza vaccination today.  She was advised on risk versus benefits and will think about it.  Past Medical History:  Diagnosis Date  . Arthritis   . Chest pain   . Health maintenance examination   . Healthcare maintenance   . Hyperlipidemia   . Hypertension   . Low back pain   . Obesity   . Osteoarthritis   . Other screening mammogram   . Scoliosis   . Tendonitis, Achilles, right   . Wrist pain    Review of Systems:   ROS negative except as per HPI.  Physical Exam: Vitals:   03/22/19 1056 03/22/19 1104  BP: 137/86 (!) 148/94  Pulse: 84 94  Temp: 98.5 F (36.9 C)   TempSrc: Oral   SpO2: 98%   Weight: 216 lb 8 oz (98.2 kg)   Height: 5\' 1"  (1.549 m)    General: A/O x4, in no acute distress, afebrile, nondiaphoretic HEENT: PEERL, EMO intact Cardio: RRR, no mrg's  Pulmonary: CTA bilaterally, no wheezing or crackles  Abdomen: Bowel sounds normal, soft, nontender  MSK: BLE nontender, nonedematous Neuro: Alert, CNII-XII grossly intact, conversational, strength 5/5 in the upper and lower extremities bilaterally, normal gait, sensation intact, DTRs +2 throughout finger-nose and heel shin intact Psych: Appropriate affect, not depressed in appearance, engages well  Assessment & Plan:   See Encounters Tab for problem based charting.  Patient discussed with Dr. Daryll Drown

## 2019-03-22 NOTE — Assessment & Plan Note (Signed)
Urge incontinence: Incontinence with increased urge, can occur in the morning, afternoon or evening.  For this she uses incontinence supplies.    Plan: We will discontinue the diuretic, encourage bladder training (handout given), voiding journal, and consider medication alternative at her next visit

## 2019-03-22 NOTE — Assessment & Plan Note (Signed)
No longer on ranitidine.  GERD is severe.  Discussed dietary modifications.  We will start pantoprazole 20 mg daily.

## 2019-03-22 NOTE — Assessment & Plan Note (Signed)
Need for cervical cancer screening: Patient has elected to schedule her cervical cancer screening for her next visit. PAP and visit same day.

## 2019-03-23 ENCOUNTER — Telehealth: Payer: Self-pay | Admitting: *Deleted

## 2019-03-23 ENCOUNTER — Telehealth: Payer: Self-pay | Admitting: Internal Medicine

## 2019-03-23 DIAGNOSIS — J309 Allergic rhinitis, unspecified: Secondary | ICD-10-CM

## 2019-03-23 DIAGNOSIS — M170 Bilateral primary osteoarthritis of knee: Secondary | ICD-10-CM

## 2019-03-23 LAB — BMP8+ANION GAP
Anion Gap: 14 mmol/L (ref 10.0–18.0)
BUN/Creatinine Ratio: 12 (ref 12–28)
BUN: 12 mg/dL (ref 8–27)
CO2: 24 mmol/L (ref 20–29)
Calcium: 9 mg/dL (ref 8.7–10.3)
Chloride: 103 mmol/L (ref 96–106)
Creatinine, Ser: 0.98 mg/dL (ref 0.57–1.00)
GFR calc Af Amer: 72 mL/min/{1.73_m2} (ref >59)
GFR calc non Af Amer: 62 mL/min/{1.73_m2} (ref >59)
Glucose: 99 mg/dL (ref 65–99)
Potassium: 4.5 mmol/L (ref 3.5–5.2)
Sodium: 141 mmol/L (ref 134–144)

## 2019-03-23 LAB — LIPID PANEL
Chol/HDL Ratio: 3.2 ratio (ref 0.0–4.4)
Cholesterol, Total: 151 mg/dL (ref 100–199)
HDL: 47 mg/dL (ref >39)
LDL Chol Calc (NIH): 91 mg/dL (ref 0–99)
Triglycerides: 63 mg/dL (ref 0–149)
VLDL Cholesterol Cal: 13 mg/dL (ref 5–40)

## 2019-03-23 MED ORDER — FLUTICASONE PROPIONATE 50 MCG/ACT NA SUSP: 1.0000 | Freq: Every day | NASAL | 5 refills | Status: DC

## 2019-03-23 MED ORDER — DICLOFENAC SODIUM 1 % EX CREA: 1.0000 g | TOPICAL_CREAM | Freq: Four times a day (QID) | CUTANEOUS | 1 refills | Status: DC | PRN

## 2019-03-23 NOTE — Telephone Encounter (Signed)
Notifid patient of her normal BMP results today.  Additionally advised her that given her HDL 47 and LDL of 91 that I would recommend no further medication changes.  She stated that she is under understood this information and will see me at her next visit.

## 2019-03-23 NOTE — Telephone Encounter (Signed)
Patient called in stating she forgot to ask for refills on Flonase and Voltaren gel at yesterday's OV. Will route to PCP. Hubbard Hartshorn, BSN, RN-BC

## 2019-03-26 ENCOUNTER — Other Ambulatory Visit: Payer: Self-pay | Admitting: Internal Medicine

## 2019-03-26 DIAGNOSIS — G8929 Other chronic pain: Secondary | ICD-10-CM

## 2019-03-26 DIAGNOSIS — M170 Bilateral primary osteoarthritis of knee: Secondary | ICD-10-CM

## 2019-03-26 MED ORDER — IBUPROFEN 800 MG PO TABS: 800.0000 mg | ORAL_TABLET | Freq: Three times a day (TID) | ORAL | 0 refills | Status: DC | PRN

## 2019-03-26 NOTE — Telephone Encounter (Signed)
Called Tiffany Pacheco. She forgot to mention to Dr. Berline Lopes last week that she has been having significant pain as described by Ander Purpura. She has tried acetaminophen with no relief, took 800 mg ibuprofen from her daughter which allowed her to have the pain controlled enough to get around. She cares for grandkids and her 61 yo mother and needs to be "on the move". She says she developed kidney injury after using naproxen in the past. Will prescribe ibuprofen 800 mg TID prn. Cautioned about the risk of kidney injury with ibuprofen as well, recommended she try to limit to 1-2 times/day. Also asked her to call back if she develops swelling in the knee or is requiring ibuprofen more than 1-2 times/day as she responded well to knee injection in the past and may need further evaluation of her knee OA and back pain.

## 2019-03-26 NOTE — Telephone Encounter (Signed)
Thank you. It is not clear to me from prior notes what she needs the ibuprofen for, did she say?

## 2019-03-26 NOTE — Telephone Encounter (Signed)
Returned call to patient. She states she has hx of scoliosis. C/o back pain, rates 9/10 at present and bilat knee pain, rates 9/10 at present, and states, "I can barely walk and the OTC stuff doesn't do any good." L. Ducatte, BSN, RN-BC

## 2019-03-26 NOTE — Telephone Encounter (Signed)
Pt would like physician  To write prescription for 800mg  Ibuprofen, pt states over the counter doesn't help;[facility], [place]

## 2019-03-27 NOTE — Progress Notes (Signed)
Internal Medicine Clinic Attending  Case discussed with Dr. Harbrecht at the time of the visit.  We reviewed the resident's history and exam and pertinent patient test results.  I agree with the assessment, diagnosis, and plan of care documented in the resident's note.   

## 2019-04-17 DIAGNOSIS — R32 Unspecified urinary incontinence: Secondary | ICD-10-CM | POA: Diagnosis not present

## 2019-05-03 ENCOUNTER — Other Ambulatory Visit: Payer: Self-pay | Admitting: Internal Medicine

## 2019-05-03 DIAGNOSIS — M170 Bilateral primary osteoarthritis of knee: Secondary | ICD-10-CM

## 2019-05-22 ENCOUNTER — Ambulatory Visit: Payer: Medicaid Other

## 2019-06-20 ENCOUNTER — Encounter: Payer: Medicaid Other | Admitting: Internal Medicine

## 2019-07-04 ENCOUNTER — Encounter: Payer: Self-pay | Admitting: Internal Medicine

## 2019-07-04 ENCOUNTER — Encounter: Payer: Medicaid Other | Admitting: Internal Medicine

## 2019-07-04 NOTE — Progress Notes (Deleted)
   CC: ***  HPI:[name] is a 64 y.o. female who presents for evaluation of ***. Please see individual problem based A/P for details.  Hypertension: Patient's BP today is ***/*** with a goal of <140/80. The patient endorses adherence to *** medication regimen. *** denied, chest pain, headache, visual changes, lightheadedness, weakness, dizziness on standing, swelling in the feet or ankles.   Plan: Continue *** ***mg *** Continue *** ***mg daily Continue *** ***mg BID  Cervical cancer screening: ***  Plan: ***  Breast cancer screening: ***  Plan:  ***  Colon cancer screening: ***  Plan:  ***  Depression, PHQ-9: Based on the patients    Office Visit from 03/08/2018 in [facility]  PHQ-9 Total Score  0     score we have ***.  Past Medical History:  Diagnosis Date  . Arthritis   . Chest pain   . Health maintenance examination   . Healthcare maintenance   . Hyperlipidemia   . Hypertension   . Low back pain   . Obesity   . Osteoarthritis   . Other screening mammogram   . Scoliosis   . Tendonitis, Achilles, right   . Wrist pain    Review of Systems:  *** ROS negative except as per HPI.  Physical Exam: There were no vitals filed for this visit. There were no vitals filed for this visit. *** Assessment & Plan:   See Encounters Tab for problem based charting.  Patient {GC/GE:[id number]::"discussed with","seen with"} Dr. Debbora Dus. Hoffman","Klima","Mullen","Narendra","Raines","Vincent"}

## 2019-07-19 ENCOUNTER — Ambulatory Visit: Payer: Medicaid Other | Admitting: Internal Medicine

## 2019-07-19 ENCOUNTER — Encounter: Payer: Self-pay | Admitting: Internal Medicine

## 2019-07-19 ENCOUNTER — Other Ambulatory Visit: Payer: Self-pay

## 2019-07-19 VITALS — BP 135/94 | HR 96 | Temp 98.1°F | Ht 61.0 in | Wt 212.6 lb

## 2019-07-19 DIAGNOSIS — M545 Low back pain: Secondary | ICD-10-CM | POA: Diagnosis not present

## 2019-07-19 DIAGNOSIS — I10 Essential (primary) hypertension: Secondary | ICD-10-CM | POA: Diagnosis not present

## 2019-07-19 DIAGNOSIS — G8929 Other chronic pain: Secondary | ICD-10-CM | POA: Diagnosis not present

## 2019-07-19 DIAGNOSIS — Z79899 Other long term (current) drug therapy: Secondary | ICD-10-CM | POA: Diagnosis not present

## 2019-07-19 DIAGNOSIS — N3941 Urge incontinence: Secondary | ICD-10-CM | POA: Diagnosis not present

## 2019-07-19 DIAGNOSIS — Z6841 Body Mass Index (BMI) 40.0 and over, adult: Secondary | ICD-10-CM

## 2019-07-19 MED ORDER — TOLTERODINE TARTRATE ER 4 MG PO CP24: 4.0000 mg | ORAL_CAPSULE | Freq: Every day | ORAL | 2 refills | Status: DC

## 2019-07-19 MED ORDER — BACLOFEN 5 MG PO TABS: 5.0000 mg | ORAL_TABLET | Freq: Two times a day (BID) | ORAL | 0 refills | Status: DC | PRN

## 2019-07-19 NOTE — Assessment & Plan Note (Signed)
Urge incontinence: Primarily presenting with symptoms of urge incontinence with increased urgency to void which occurs frequently.  Patient cannot perform her own shopping or be out of the house without adult underwear due to the frequency and inability to make it to the restroom in time.  Patient often must stop tasks at home to avoid. ADL's are disrupted due to her incontinence. This is very troubling for the patient.   Plan: Recommending weight loss, smoking cessation, Kegel exercises, avoiding constipation Additionally, prescribed tolterodine 4 mg every 24 hours for urge incontinence Approved adult underwear

## 2019-07-19 NOTE — Assessment & Plan Note (Signed)
Hypertension: Patient's BP today is 135/94 with a goal of <140/80. The patient endorses adherence to her medication regimen. She denied, chest pain, headache, visual changes, lightheadedness, weakness, dizziness on standing, swelling in the feet or ankles.   Plan: Continue amlodipine 5mg  daily Continue lisinopril 20 mg daily BMP at next visit

## 2019-07-19 NOTE — Assessment & Plan Note (Signed)
Recurrent. Prevents her from moving quickly enough to the restroom to void without incontinence.  There is no radiation, loss of bowel control, numbness or tingling of the perianal area or legs.  Today the pain is most consistent with a lower thoracic muscle spasm superimposed on chronic lumbago.  Plan: Aleve 220 mg twice daily for 5 days Baclofen 5 mg as needed

## 2019-07-19 NOTE — Progress Notes (Signed)
   CC: urinary incontinence   HPI:[name] is a 64 y.o. female who presents for evaluation of urinary incontinence. Please see individual problem based A/P for details.  Past Medical History:  Diagnosis Date  . Arthritis   . Chest pain   . Health maintenance examination   . Healthcare maintenance   . Hyperlipidemia   . Hypertension   . Low back pain   . Obesity   . Osteoarthritis   . Other screening mammogram   . Scoliosis   . Tendonitis, Achilles, right   . Wrist pain    Review of Systems:   ROS negative except as per HPI.  Physical Exam: Vitals:   07/19/19 1600  BP: (!) 135/94  Pulse: 96  Temp: 98.1 F (36.7 C)  TempSrc: Oral  SpO2: 98%  Weight: 212 lb 9.6 oz (96.4 kg)  Height: 5\' 1"  (1.549 m)   Filed Weights   07/19/19 1600  Weight: 212 lb 9.6 oz (96.4 kg)   General: A/O x4, in no acute distress, afebrile, nondiaphoretic HEENT: PEERL, EMO intact Cardio: RRR, no mrg's  Pulmonary: CTA bilaterally, no wheezing or crackles  Abdomen: Bowel sounds normal, soft, nontender  MSK: BLE nontender, nonedematous Neuro: Alert, CNII-XII grossly intact, conversational, strength 5/5 in the upper and lower extremities bilaterally, normal gait Psych: Appropriate affect, not depressed in appearance, engages well  Assessment & Plan:   See Encounters Tab for problem based charting.  Patient discussed with Dr. 07/21/19

## 2019-07-19 NOTE — Patient Instructions (Signed)
FOLLOW-UP INSTRUCTIONS When: At your PCPs next available opening you are able to attend For: a regular visit What to bring: All of your medications  Today we discussed your urge incontinence.  This appears to be limiting a function without incontinence supplies.  I recommend a trial of tolterodine 4 mg daily and will complete the form for the incontinence supplies.please schedule as soon as possible for follow-up visit to discuss routine care.  Thank you for your visit to the [facility] today. If you have any questions or concerns please call us at [phone].

## 2019-07-20 ENCOUNTER — Telehealth: Payer: Self-pay | Admitting: *Deleted

## 2019-07-20 NOTE — Telephone Encounter (Signed)
Order for incontinence supplies, CMN, and yesterday's OV note faxed to Burnadette Peter at [facility] at [phone]. Fax confirmation receipt received. Kinnie Feil, BSN, RN-BC

## 2019-07-24 NOTE — Progress Notes (Signed)
Internal Medicine Clinic Attending  Case discussed with Dr. Harbrecht at the time of the visit.  We reviewed the resident's history and exam and pertinent patient test results.  I agree with the assessment, diagnosis, and plan of care documented in the resident's note.   

## 2019-07-31 ENCOUNTER — Other Ambulatory Visit: Payer: Self-pay | Admitting: Internal Medicine

## 2019-07-31 DIAGNOSIS — J309 Allergic rhinitis, unspecified: Secondary | ICD-10-CM

## 2019-08-08 ENCOUNTER — Telehealth: Payer: Self-pay | Admitting: *Deleted

## 2019-08-08 DIAGNOSIS — N3941 Urge incontinence: Secondary | ICD-10-CM

## 2019-08-08 NOTE — Telephone Encounter (Signed)
Detrol LA ordered for patient is not covered.  Patient's  insurance will however pay for Oxybutynin Chloride 10 mg Tr24, Oxybutynin Chloride tablets or Toviaz 8 mg tbl 24 with a PA.  Message to be sent to Dr. Crista Elliot to consider a change.  Angelina Ok, RN  08/08/2019 9:44 AM.

## 2019-08-10 MED ORDER — OXYBUTYNIN CHLORIDE ER 10 MG PO TB24: 10.0000 mg | ORAL_TABLET | Freq: Every day | ORAL | 2 refills | Status: DC

## 2019-08-10 NOTE — Telephone Encounter (Signed)
Thank you Venita Sheffield, I sent a script for Oxybutynin 10mg  Tb24 to the pharmacy. Please let me know if there is another issue.

## 2019-08-16 DIAGNOSIS — R32 Unspecified urinary incontinence: Secondary | ICD-10-CM | POA: Diagnosis not present

## 2019-08-31 ENCOUNTER — Other Ambulatory Visit: Payer: Self-pay | Admitting: Internal Medicine

## 2019-08-31 DIAGNOSIS — I10 Essential (primary) hypertension: Secondary | ICD-10-CM

## 2019-08-31 DIAGNOSIS — K219 Gastro-esophageal reflux disease without esophagitis: Secondary | ICD-10-CM

## 2019-09-03 ENCOUNTER — Encounter: Payer: Self-pay | Admitting: *Deleted

## 2019-09-18 ENCOUNTER — Telehealth: Payer: Self-pay | Admitting: *Deleted

## 2019-09-18 NOTE — Telephone Encounter (Signed)
09/18/2019  15:42  I called Tiffany Pacheco and explained to her recollection of the IFOBT kit was needed.  The IFOBT collected 07-19-2019 was not received by mail in a timely manner.  Test was too old for analysis.  Tiffany Pacheco agreed to recollect and hand deliver the IFOBT kit when she comes to her next appoint with  Dr Tiffany Pacheco on 10/03/2019.  Kit taken to [facility] 09/18/2019   Tiffany Pacheco, PBT

## 2019-09-18 NOTE — Telephone Encounter (Signed)
09/18/2019  14:12  I tried to call Skip Estimable in reference to IFOBT (recollection)   No answer. Not able to leave a message.  Maryan Rued, PBT Clinic Lab

## 2019-10-03 ENCOUNTER — Encounter: Payer: Medicaid Other | Admitting: Internal Medicine

## 2019-10-04 ENCOUNTER — Ambulatory Visit (INDEPENDENT_AMBULATORY_CARE_PROVIDER_SITE_OTHER): Payer: Medicaid Other | Admitting: Internal Medicine

## 2019-10-04 ENCOUNTER — Encounter: Payer: Self-pay | Admitting: Internal Medicine

## 2019-10-04 ENCOUNTER — Other Ambulatory Visit: Payer: Self-pay

## 2019-10-04 DIAGNOSIS — I10 Essential (primary) hypertension: Secondary | ICD-10-CM

## 2019-10-04 DIAGNOSIS — N3941 Urge incontinence: Secondary | ICD-10-CM | POA: Diagnosis not present

## 2019-10-04 NOTE — Progress Notes (Signed)
Internal Medicine Clinic Attending  Case discussed with Dr. Harbrecht at the time of the visit.  We reviewed the resident's history and exam and pertinent patient test results.  I agree with the assessment, diagnosis, and plan of care documented in the resident's note.   

## 2019-10-04 NOTE — Assessment & Plan Note (Signed)
Urge Incontinence: On her last visit we discussed urge incontinence which was disruptive of her ADL's and IADL's. She did pick up and start the tolterodine 4mg  daily for her urge incontinence which has greatly reduced the frequency of the urges which has allowed her greater autonomy in life.  Plan: Continue tolterodine 4mg  daily

## 2019-10-04 NOTE — Assessment & Plan Note (Signed)
HTN: Patient checked her BP at home today with what I believe to be is a reliable device. BP today was 147/95. She has been dealing with a lot of family concerns which have stressed her out to which she attributes the higher BP. She endorsed some headaches, but denied lightheadedness, dizziness on standing or swelling in her feet. Her headaches improve with loratadine and are worse when she is outside. Given that her last BP and this BP reading are both above goal with at least the diastolic and now both the diastolic and systolic I do feel we should make a medication change in addition to continued encouragement of life style changes.  Plan: Continue Amlodipine increased to 10mg  daily Continue Lisinopril 20mg  daily BMP at next visit

## 2019-10-04 NOTE — Progress Notes (Signed)
  [facility] Residency Telephone Encounter Continuity Care Appointment  HPI:   This telephone encounter was created for [name] on 10/04/2019 for the following purpose/cc HTN and urge incontinence.   Past Medical History:  Past Medical History:  Diagnosis Date  . Arthritis   . Chest pain   . Health maintenance examination   . Healthcare maintenance   . Hyperlipidemia   . Hypertension   . Low back pain   . Obesity   . Osteoarthritis   . Other screening mammogram   . Scoliosis   . Tendonitis, Achilles, right   . Wrist pain       ROS:   See A/P   Assessment / Plan / Recommendations:   Please see A&P under problem oriented charting for assessment of the patient's acute and chronic medical conditions.   As always, pt is advised that if symptoms worsen or new symptoms arise, they should go to an urgent care facility or to to ER for further evaluation.   Consent and Medical Decision Making:   Patient discussed with Dr. Antony Contras.  This is a telephone encounter between Tiffany Pacheco and Tiffany Pacheco on 10/04/2019 for HTN. The visit was conducted with the patient located at home and Tiffany Pacheco at [facility]. The patient's identity was confirmed using their DOB and current address. The patient has consented to being evaluated through a telephone encounter and understands the associated risks (an examination cannot be done and the patient may need to come in for an appointment) / benefits (allows the patient to remain at home, decreasing exposure to coronavirus). I personally spent 8 minutes on medical discussion.

## 2019-10-05 ENCOUNTER — Encounter: Payer: Medicaid Other | Admitting: Internal Medicine

## 2019-10-09 ENCOUNTER — Other Ambulatory Visit: Payer: Self-pay | Admitting: Internal Medicine

## 2019-10-09 DIAGNOSIS — J309 Allergic rhinitis, unspecified: Secondary | ICD-10-CM

## 2019-10-26 DIAGNOSIS — R32 Unspecified urinary incontinence: Secondary | ICD-10-CM | POA: Diagnosis not present

## 2019-10-31 ENCOUNTER — Other Ambulatory Visit: Payer: Self-pay | Admitting: Internal Medicine

## 2019-10-31 DIAGNOSIS — N3941 Urge incontinence: Secondary | ICD-10-CM

## 2019-11-19 DIAGNOSIS — R32 Unspecified urinary incontinence: Secondary | ICD-10-CM | POA: Diagnosis not present

## 2019-11-19 DIAGNOSIS — I10 Essential (primary) hypertension: Secondary | ICD-10-CM | POA: Diagnosis not present

## 2019-11-26 ENCOUNTER — Telehealth: Payer: Self-pay | Admitting: *Deleted

## 2019-11-26 NOTE — Telephone Encounter (Signed)
Received faxed request for order, CMN, and OV notes addressing need for urinary incontinence supplies from [place] D at [facility]. This was addressed with previous PCP on 10/04/2019. Forms placed in [facility]'s box for signatures. Kinnie Feil, BSN, RN-BC

## 2019-11-29 NOTE — Telephone Encounter (Signed)
Order, CMN, and OV notes from 10/04/2019 addressing need for urinary incontinence supplies faxed to [facility] at [facility]. Fax confirmation receipt received. Kinnie Feil, BSN, RN-BC

## 2020-01-14 DIAGNOSIS — R32 Unspecified urinary incontinence: Secondary | ICD-10-CM | POA: Diagnosis not present

## 2020-01-14 DIAGNOSIS — I10 Essential (primary) hypertension: Secondary | ICD-10-CM | POA: Diagnosis not present

## 2020-03-06 ENCOUNTER — Other Ambulatory Visit: Payer: Self-pay | Admitting: Internal Medicine

## 2020-03-06 DIAGNOSIS — N3941 Urge incontinence: Secondary | ICD-10-CM

## 2020-03-06 NOTE — Assessment & Plan Note (Signed)
Call patient regarding medication refill requested for this problem.  She requested oxybutynin, last documentation indicates she has been on tolterodine.  Seems that she was last placed on oxybutynin in July but unclear if there is reason for a change in medication.  Patient was unaware of the change and does not know why.  She does not believe that she has any more of the tolterodine and is not taking it.  States that this problem has improved somewhat as she is not requiring this medication every day.  -Refill oxybutynin -Discontinue tolterodine

## 2020-03-17 DIAGNOSIS — I10 Essential (primary) hypertension: Secondary | ICD-10-CM | POA: Diagnosis not present

## 2020-03-17 DIAGNOSIS — R32 Unspecified urinary incontinence: Secondary | ICD-10-CM | POA: Diagnosis not present

## 2020-04-03 ENCOUNTER — Encounter: Payer: Medicaid Other | Admitting: Student

## 2020-04-03 NOTE — Progress Notes (Deleted)
   CC: ***  HPI:  [name] is a 64 y.o. person with history of *** who presents to clinic for ***. Their last clinic visit was on ***.   To see the details of this patient's management of their acute and chronic problems, please refer to the Assessment & Plan under the Encounters tab.    Past Medical History:  Diagnosis Date  . Arthritis   . Chest pain   . Health maintenance examination   . Healthcare maintenance   . Hyperlipidemia   . Hypertension   . Low back pain   . Obesity   . Osteoarthritis   . Other screening mammogram   . Scoliosis   . Tendonitis, Achilles, right   . Wrist pain    Review of Systems:    ROS  Physical Exam:  There were no vitals filed for this visit. Constitutional: well-appearing *** sitting in chair, in no acute distress HENT: normocephalic atraumatic, mucous membranes moist Eyes: conjunctiva non-erythematous Neck: supple Cardiovascular: regular rate and rhythm, no m/r/g Pulmonary/Chest: normal work of breathing on room air, lungs clear to auscultation bilaterally Abdominal: soft, non-tender, non-distended MSK: normal bulk and tone Neurological: alert & oriented x 3, 5/5 strength in bilateral upper and lower extremities, normal gait Skin: warm and dry*** Psych: ***    Assessment & Plan:   See Encounters Tab for problem based charting.  Patient {GC/GE:3044014::"discussed with","seen with"} Dr. {NAMES:[id number]::"Butcher","Guilloud","Hoffman","Mullen","Narendra","Raines","Vincent"}

## 2020-04-14 DIAGNOSIS — I10 Essential (primary) hypertension: Secondary | ICD-10-CM | POA: Diagnosis not present

## 2020-04-14 DIAGNOSIS — R32 Unspecified urinary incontinence: Secondary | ICD-10-CM | POA: Diagnosis not present

## 2020-06-02 ENCOUNTER — Other Ambulatory Visit: Payer: Self-pay

## 2020-06-02 DIAGNOSIS — J309 Allergic rhinitis, unspecified: Secondary | ICD-10-CM

## 2020-06-02 MED ORDER — FLUTICASONE PROPIONATE 50 MCG/ACT NA SUSP: 1.0000 | Freq: Every day | NASAL | 5 refills | Status: DC

## 2020-06-02 MED ORDER — LORATADINE 10 MG PO TABS: ORAL_TABLET | ORAL | 11 refills | Status: DC

## 2020-06-02 NOTE — Telephone Encounter (Signed)
Need refill on allergic medicine; pt contact [phone]   [facility] - [place], [place] - [address]

## 2020-06-17 DIAGNOSIS — R32 Unspecified urinary incontinence: Secondary | ICD-10-CM | POA: Diagnosis not present

## 2020-06-17 DIAGNOSIS — I10 Essential (primary) hypertension: Secondary | ICD-10-CM | POA: Diagnosis not present

## 2020-08-20 DIAGNOSIS — I10 Essential (primary) hypertension: Secondary | ICD-10-CM | POA: Diagnosis not present

## 2020-08-20 DIAGNOSIS — R32 Unspecified urinary incontinence: Secondary | ICD-10-CM | POA: Diagnosis not present

## 2020-09-27 ENCOUNTER — Encounter: Payer: Self-pay | Admitting: *Deleted

## 2020-09-30 ENCOUNTER — Other Ambulatory Visit: Payer: Self-pay | Admitting: Student

## 2020-09-30 DIAGNOSIS — N3941 Urge incontinence: Secondary | ICD-10-CM

## 2020-10-20 ENCOUNTER — Other Ambulatory Visit: Payer: Self-pay | Admitting: Student

## 2020-10-20 DIAGNOSIS — K219 Gastro-esophageal reflux disease without esophagitis: Secondary | ICD-10-CM

## 2020-10-20 DIAGNOSIS — E785 Hyperlipidemia, unspecified: Secondary | ICD-10-CM

## 2020-10-20 DIAGNOSIS — I10 Essential (primary) hypertension: Secondary | ICD-10-CM

## 2020-10-20 NOTE — Telephone Encounter (Signed)
Called patient to schedule a future appointment.  Unable to schedule an appointment at this time, will call back after speaking with her daughter.  Forwarding back to triage.

## 2020-10-20 NOTE — Telephone Encounter (Signed)
Refill Request per the patient she has already contacted her pharmacy who asked her to call her PCP to f/u with the following medication request  amLODipine (NORVASC) 5 MG tablet pantoprazole (PROTONIX) 20 MG tablet   [facility] - [place], [place] - [address] (Ph: [phone])

## 2020-10-23 NOTE — Telephone Encounter (Signed)
Labs have not been checked since 2020. Refills will be provided at time of follow up.

## 2020-11-05 DIAGNOSIS — R32 Unspecified urinary incontinence: Secondary | ICD-10-CM | POA: Diagnosis not present

## 2020-11-05 DIAGNOSIS — I10 Essential (primary) hypertension: Secondary | ICD-10-CM | POA: Diagnosis not present

## 2020-11-06 ENCOUNTER — Other Ambulatory Visit: Payer: Self-pay

## 2020-11-06 DIAGNOSIS — I10 Essential (primary) hypertension: Secondary | ICD-10-CM

## 2020-11-06 DIAGNOSIS — M170 Bilateral primary osteoarthritis of knee: Secondary | ICD-10-CM

## 2020-11-06 MED ORDER — DICLOFENAC SODIUM 1 % EX GEL
CUTANEOUS | 0 refills | Status: DC
Start: 2020-11-06 — End: 2020-12-08

## 2020-11-06 MED ORDER — AMLODIPINE BESYLATE 5 MG PO TABS: 5.0000 mg | ORAL_TABLET | Freq: Every day | ORAL | 1 refills | Status: DC

## 2020-11-06 NOTE — Telephone Encounter (Signed)
  diclofenac Sodium (VOLTAREN) 1 % GEL  amLODipine (NORVASC) 5 MG tablet, refill request @  [facility] - [place], [place] - [address] Phone:  [phone]  Fax:  [phone]

## 2020-11-07 ENCOUNTER — Other Ambulatory Visit: Payer: Self-pay | Admitting: Student

## 2020-11-07 DIAGNOSIS — N3941 Urge incontinence: Secondary | ICD-10-CM

## 2020-11-12 ENCOUNTER — Ambulatory Visit (INDEPENDENT_AMBULATORY_CARE_PROVIDER_SITE_OTHER): Payer: Medicaid Other | Admitting: Internal Medicine

## 2020-11-12 VITALS — BP 144/88 | HR 87 | Temp 98.2°F | Ht 61.0 in | Wt 221.0 lb

## 2020-11-12 DIAGNOSIS — R600 Localized edema: Secondary | ICD-10-CM | POA: Diagnosis not present

## 2020-11-12 DIAGNOSIS — Z6841 Body Mass Index (BMI) 40.0 and over, adult: Secondary | ICD-10-CM

## 2020-11-12 DIAGNOSIS — Z0000 Encounter for general adult medical examination without abnormal findings: Secondary | ICD-10-CM

## 2020-11-12 NOTE — Patient Instructions (Signed)
Amlodipine increased from 5 mg to 10 mg.  Your current prescription is 5 mg so take 2 pills daily  2.  Start wearing compression socks to reduce the leg swelling.  Also elevate the feet at night with 1 pillow under your feet.  3.  Start exercising and doing the dance video 30 minutes a day for exercise.  4.  Read through diet plan management attached.

## 2020-11-12 NOTE — Assessment & Plan Note (Signed)
Ms. Koslowski admits to recent weight gain and lack of exercise.   PLAN: I counseled Ms Gibeault on the importance of exercise and diet. I provided Ms. Eckles with pamphlet on diet changes and calorie intake.

## 2020-11-12 NOTE — Assessment & Plan Note (Addendum)
Tiffany Pacheco presents today with lower extremity edema.  She denies any pain, numbness, tingling of the bilateral lower leg.  She stated that the lower extremity edema began 1 week ago.   PLAN: Recommended compression socks. Recommended elevation of the lower legs.

## 2020-11-12 NOTE — Progress Notes (Signed)
CC: Follow up, lower extremity edema  HPI:  [name] is a 65 y.o. female with a past medical history stated below and presents today for follow-up visit and lower extremity edema.  [name] stated that the lower extremity edema began 1 week ago.  She denies any pain, muscle cramps, numbness, or tingling associated with the leg edema.  She denies any chest pain or shortness of breath.  She admits to gaining more weight recently and poor diet.   Please see problem based assessment and plan for additional details.  Past Medical History:  Diagnosis Date   Arthritis    Chest pain    Health maintenance examination    Healthcare maintenance    Hyperlipidemia    Hypertension    Low back pain    Obesity    Osteoarthritis    Other screening mammogram    Scoliosis    Tendonitis, Achilles, right    Wrist pain     Current Outpatient Medications on File Prior to Visit  Medication Sig Dispense Refill   acetaminophen (TYLENOL) 325 MG tablet Take 1-2 tablets (325-650 mg total) by mouth every 8 (eight) hours as needed. (Patient not taking: Reported on 04/20/2016) 60 tablet 2   amLODipine (NORVASC) 5 MG tablet Take 1 tablet (5 mg total) by mouth daily. 90 tablet 1   atorvastatin (LIPITOR) 40 MG tablet Take 1 tablet (40 mg total) by mouth daily. 90 tablet 1   Baclofen 5 MG TABS Take 5 mg by mouth 2 (two) times daily as needed. 20 tablet 0   diclofenac Sodium (VOLTAREN) 1 % GEL APPLY 1 GRAMS TOPICALLY 4 (FOUR) TIMES DAILY AS NEEDED. 100 g 0   fluticasone (FLONASE) 50 MCG/ACT nasal spray Place 1 spray into both nostrils daily. 16 g 5   ibuprofen (ADVIL) 800 MG tablet Take 1 tablet (800 mg total) by mouth every 8 (eight) hours as needed for moderate pain. Take with food. 30 tablet 0   lisinopril (ZESTRIL) 20 MG tablet TAKE 1 TABLET (20 MG TOTAL) BY MOUTH DAILY. 90 tablet 1   lisinopril-hydrochlorothiazide (PRINZIDE,ZESTORETIC) 20-12.5 MG tablet TAKE 1 TABLET BY MOUTH DAILY. 90 tablet 3    loratadine (ALLERGY RELIEF) 10 MG tablet TAKE 1 TABLET (10 MG TOTAL) BY MOUTH DAILY. FOR ALLERGIES 30 tablet 11   ondansetron (ZOFRAN ODT) 4 MG disintegrating tablet Take 1 tablet (4 mg total) by mouth every 8 (eight) hours as needed for nausea or vomiting. (Patient not taking: Reported on 04/20/2016) 7 tablet 0   oxybutynin (DITROPAN-XL) 10 MG 24 hr tablet TAKE 1 TABLET (10 MG TOTAL) BY MOUTH DAILY. 90 tablet 0   pantoprazole (PROTONIX) 20 MG tablet TAKE 1 TABLET (20 MG TOTAL) BY MOUTH DAILY. 90 tablet 1   [DISCONTINUED] famotidine (PEPCID) 20 MG tablet Take 20 mg by mouth daily.       No current facility-administered medications on file prior to visit.    Family History  Problem Relation Age of Onset   Diabetes Mother    Hypertension Mother    Cancer Cousin     Social History   Socioeconomic History   Marital status: Divorced    Spouse name: Not on file   Number of children: Not on file   Years of education: Not on file   Highest education level: Not on file  Occupational History   Not on file  Tobacco Use   Smoking status: Former    Packs/day: 0.50    Years: 2.00  Pack years: 1.00    Types: Cigarettes    Quit date: 11/24/1988    Years since quitting: 31.9   Smokeless tobacco: Never  Substance and Sexual Activity   Alcohol use: No   Drug use: No   Sexual activity: Not on file  Other Topics Concern   Not on file  Social History Narrative   Not on file   Social Determinants of Health   Financial Resource Strain: Not on file  Food Insecurity: Not on file  Transportation Needs: Not on file  Physical Activity: Not on file  Stress: Not on file  Social Connections: Not on file  Intimate Partner Violence: Not on file    Review of Systems  Constitutional:  Negative for chills, fever and weight loss.  Eyes:  Negative for blurred vision.  Respiratory:  Negative for shortness of breath and wheezing.   Cardiovascular:  Positive for leg swelling. Negative for chest  pain and palpitations.  Gastrointestinal:  Negative for abdominal pain, constipation, diarrhea, nausea and vomiting.  Neurological:  Negative for dizziness, tingling and headaches.    Vitals:   11/12/20 0941  BP: (!) 144/88  Pulse: 87  Temp: 98.2 F (36.8 C)  TempSrc: Oral  SpO2: 98%  Weight: 221 lb (100.2 kg)  Height: 5\' 1"  (1.549 m)     Physical Exam Constitutional:      Appearance: Normal appearance. She is obese.  HENT:     Head: Normocephalic and atraumatic.  Cardiovascular:     Rate and Rhythm: Normal rate and regular rhythm.     Pulses:          Dorsalis pedis pulses are 2+ on the right side and 2+ on the left side.       Posterior tibial pulses are 2+ on the right side and 2+ on the left side.     Heart sounds: Normal heart sounds, S1 normal and S2 normal. No murmur heard.   No gallop.  Pulmonary:     Effort: Pulmonary effort is normal.     Breath sounds: Normal breath sounds and air entry.  Abdominal:     General: Bowel sounds are normal.     Palpations: Abdomen is soft.     Tenderness: There is no abdominal tenderness. There is no guarding.  Musculoskeletal:     Right lower leg: 1+ Edema present.     Left lower leg: 1+ Edema present.  Skin:    General: Skin is warm and dry.  Neurological:     Mental Status: She is alert and oriented to person, place, and time.  Psychiatric:        Attention and Perception: Attention and perception normal.        Mood and Affect: Mood and affect normal.        Speech: Speech normal.        Behavior: Behavior normal. Behavior is cooperative.     Assessment & Plan:   See Encounters Tab for problem based charting.  Patient seen with Dr. , M.D. [facility] Internal Medicine, PGY-1 Pager: [phone], Phone: [phone] Date 11/12/2020 Time 10:57 AM

## 2020-11-13 LAB — BMP8+ANION GAP
Anion Gap: 15 mmol/L (ref 10.0–18.0)
BUN/Creatinine Ratio: 14 (ref 12–28)
BUN: 11 mg/dL (ref 8–27)
CO2: 23 mmol/L (ref 20–29)
Calcium: 9.3 mg/dL (ref 8.7–10.3)
Chloride: 103 mmol/L (ref 96–106)
Creatinine, Ser: 0.77 mg/dL (ref 0.57–1.00)
Glucose: 118 mg/dL — ABNORMAL HIGH (ref 65–99)
Potassium: 4.4 mmol/L (ref 3.5–5.2)
Sodium: 141 mmol/L (ref 134–144)
eGFR: 87 mL/min/{1.73_m2} (ref >59)

## 2020-11-13 LAB — HEMOGLOBIN A1C
Est. average glucose Bld gHb Est-mCnc: 134 mg/dL
Hgb A1c MFr Bld: 6.3 % — ABNORMAL HIGH (ref 4.8–5.6)

## 2020-11-27 ENCOUNTER — Telehealth: Payer: Self-pay

## 2020-11-27 NOTE — Telephone Encounter (Signed)
Not able to leave message for pt.   RE: scheduling for mobile mammo event in August or September.

## 2020-12-05 ENCOUNTER — Other Ambulatory Visit: Payer: Self-pay | Admitting: Internal Medicine

## 2020-12-05 ENCOUNTER — Other Ambulatory Visit: Payer: Self-pay | Admitting: Student

## 2020-12-05 DIAGNOSIS — K219 Gastro-esophageal reflux disease without esophagitis: Secondary | ICD-10-CM

## 2020-12-05 DIAGNOSIS — M170 Bilateral primary osteoarthritis of knee: Secondary | ICD-10-CM

## 2020-12-05 NOTE — Telephone Encounter (Signed)
MED REFILL REQUEST  pantoprazole (PROTONIX) 20 MG tablet   [facility] - [place], [place] - [address] Phone:  [phone]  Fax:  [phone]

## 2020-12-08 MED ORDER — PANTOPRAZOLE SODIUM 20 MG PO TBEC: 20.0000 mg | DELAYED_RELEASE_TABLET | Freq: Every day | ORAL | 1 refills | Status: DC

## 2021-01-13 ENCOUNTER — Other Ambulatory Visit: Payer: Self-pay | Admitting: Student

## 2021-01-13 DIAGNOSIS — N3941 Urge incontinence: Secondary | ICD-10-CM

## 2021-01-14 ENCOUNTER — Ambulatory Visit (INDEPENDENT_AMBULATORY_CARE_PROVIDER_SITE_OTHER): Payer: Medicaid Other | Admitting: Student

## 2021-01-14 DIAGNOSIS — N3941 Urge incontinence: Secondary | ICD-10-CM | POA: Diagnosis not present

## 2021-01-14 MED ORDER — OXYBUTYNIN CHLORIDE ER 10 MG PO TB24: 10.0000 mg | ORAL_TABLET | Freq: Every day | ORAL | 0 refills | Status: DC

## 2021-01-16 NOTE — Progress Notes (Signed)
   CC: Incontinence supplies  This is a telephone encounter between Tiffany Pacheco and Tiffany Pacheco on 01/16/2021 for DME for incontinence supplies. The visit was conducted with the patient located at home and Tiffany Pacheco at [facility]. The patient's identity was confirmed using their DOB and current address. The patient has consented to being evaluated through a telephone encounter and understands the associated risks (an examination cannot be done and the patient may need to come in for an appointment) / benefits (allows the patient to remain at home, decreasing exposure to coronavirus). I personally spent 10 minutes on medical discussion.   HPI:  [name] is a 65 y.o. with PMH as below.   Please see A&P for assessment of the patient's acute and chronic medical conditions.   Past Medical History:  Diagnosis Date   Arthritis    Chest pain    Health maintenance examination    Healthcare maintenance    Hyperlipidemia    Hypertension    Low back pain    Obesity    Osteoarthritis    Other screening mammogram    Scoliosis    Tendonitis, Achilles, right    Wrist pain    Review of Systems:  Negative as per HPI   Assessment & Plan:   See Encounters Tab for problem based charting.  Patient discussed with Dr.  Mayford Pacheco

## 2021-01-16 NOTE — Assessment & Plan Note (Signed)
Patient today called for telehealth visit regarding her incontinence supplies.  She needs paperwork done to continue to receive supplies.  She has been doing well with her oxybutynin.  Has had issues with urge incontinence ever since she had her last child.  Paperwork completed for DME today.

## 2021-01-19 NOTE — Progress Notes (Signed)
Internal Medicine Clinic Attending ? ?Case discussed with Dr. Liang  At the time of the visit.  We reviewed the resident?s history and exam and pertinent patient test results.  I agree with the assessment, diagnosis, and plan of care documented in the resident?s note. ? ?

## 2021-01-20 ENCOUNTER — Telehealth: Payer: Self-pay

## 2021-01-20 NOTE — Telephone Encounter (Signed)
I faxed office notes to [employer] for Incontinence supplies [phone] confirmation received Vel[place], Cyrene Gharibian C9/20/20225:10 PM

## 2021-01-22 DIAGNOSIS — R32 Unspecified urinary incontinence: Secondary | ICD-10-CM | POA: Diagnosis not present

## 2021-01-22 DIAGNOSIS — I10 Essential (primary) hypertension: Secondary | ICD-10-CM | POA: Diagnosis not present

## 2021-02-13 ENCOUNTER — Other Ambulatory Visit: Payer: Self-pay

## 2021-02-13 DIAGNOSIS — I10 Essential (primary) hypertension: Secondary | ICD-10-CM

## 2021-02-13 NOTE — Telephone Encounter (Signed)
lisinopril (ZESTRIL) 20 MG tablet,  lisinopril-hydrochlorothiazide (PRINZIDE,ZESTORETIC) 20-12.5 MG tablet, REFILL REQUEST @ [facility] - [place], [place] - [address].

## 2021-02-13 NOTE — Telephone Encounter (Signed)
Both Lisinopril 20mg  and Lisinopril/HCTZ is listed on patient's med list.  TC to patient, RN asked patient what she is currently taking for her HTN and she states Lisinopril 20mg  daily.  She states she no longer takes the lisinopril combo pill.  Per LOV note w/ PCP on 7/13, amlodipine was increased from 5mg  to 10mg .  Patient states she is also taking the amlodipine 10mg .   Will send Lisinopril 20mg  refill to PCP.   Dr. , Please update patient's med list and clarify pt is supposed to be taking lisinopril 20mg  daily. Thank you! SChaplin, RN,BSN

## 2021-02-16 ENCOUNTER — Encounter: Payer: Medicaid Other | Admitting: Internal Medicine

## 2021-02-16 MED ORDER — LISINOPRIL 20 MG PO TABS: 20.0000 mg | ORAL_TABLET | Freq: Every day | ORAL | 1 refills | Status: DC

## 2021-02-20 ENCOUNTER — Encounter: Payer: Medicaid Other | Admitting: Internal Medicine

## 2021-02-24 ENCOUNTER — Other Ambulatory Visit: Payer: Self-pay | Admitting: Student

## 2021-02-24 DIAGNOSIS — J309 Allergic rhinitis, unspecified: Secondary | ICD-10-CM

## 2021-03-09 ENCOUNTER — Telehealth: Payer: Self-pay | Admitting: Internal Medicine

## 2021-03-09 DIAGNOSIS — R32 Unspecified urinary incontinence: Secondary | ICD-10-CM | POA: Diagnosis not present

## 2021-03-09 DIAGNOSIS — I10 Essential (primary) hypertension: Secondary | ICD-10-CM | POA: Diagnosis not present

## 2021-03-09 NOTE — Telephone Encounter (Signed)
Pt reports she has already contacted [employer] about her incontinence supplies.  Patient states she needs to know what is the hold up.  Please call the patient back.

## 2021-03-10 NOTE — Telephone Encounter (Signed)
I called [employer] urology company @[phone] to inquire about patients supplies.The intake person looked up he account  and 3 packages were sent by [employer] at 12:37 today at her home.I called the patient back and she received them [place], [place] C11/8/20224:48 PM

## 2021-04-02 DIAGNOSIS — R32 Unspecified urinary incontinence: Secondary | ICD-10-CM | POA: Diagnosis not present

## 2021-04-02 DIAGNOSIS — I10 Essential (primary) hypertension: Secondary | ICD-10-CM | POA: Diagnosis not present

## 2021-04-22 ENCOUNTER — Encounter: Payer: Medicaid Other | Admitting: Internal Medicine

## 2021-05-05 DIAGNOSIS — R32 Unspecified urinary incontinence: Secondary | ICD-10-CM | POA: Diagnosis not present

## 2021-05-05 DIAGNOSIS — I10 Essential (primary) hypertension: Secondary | ICD-10-CM | POA: Diagnosis not present

## 2021-05-07 ENCOUNTER — Encounter: Payer: Self-pay | Admitting: Internal Medicine

## 2021-05-07 ENCOUNTER — Other Ambulatory Visit: Payer: Self-pay

## 2021-05-07 ENCOUNTER — Ambulatory Visit (HOSPITAL_COMMUNITY)
Admission: RE | Admit: 2021-05-07 | Discharge: 2021-05-07 | Disposition: A | Discharge status: Home / Self Care | Payer: Medicaid Other | Source: Ambulatory Visit | Attending: Internal Medicine | Admitting: Internal Medicine

## 2021-05-07 ENCOUNTER — Ambulatory Visit: Payer: Medicaid Other | Admitting: Internal Medicine

## 2021-05-07 VITALS — BP 153/95 | HR 98 | Temp 98.2°F | Ht 62.0 in | Wt 222.5 lb

## 2021-05-07 DIAGNOSIS — I10 Essential (primary) hypertension: Secondary | ICD-10-CM

## 2021-05-07 DIAGNOSIS — Z1231 Encounter for screening mammogram for malignant neoplasm of breast: Secondary | ICD-10-CM

## 2021-05-07 DIAGNOSIS — M170 Bilateral primary osteoarthritis of knee: Secondary | ICD-10-CM

## 2021-05-07 DIAGNOSIS — R0609 Other forms of dyspnea: Secondary | ICD-10-CM | POA: Diagnosis not present

## 2021-05-07 MED ORDER — DICLOFENAC SODIUM 1 % EX GEL: 2.0000 g | Freq: Four times a day (QID) | CUTANEOUS | 2 refills | Status: DC

## 2021-05-07 NOTE — Assessment & Plan Note (Signed)
Patient endorses DOE for the past several months. She also complains of orthopnea and leg swelling intermittently. States she has not been able to lay flat for a long time due to difficulty breathing. She denies any chest pain or diaphoresis. Her leg swelling comes and goes and at times just in her knees. She has difficulty ambulating around her home and especially with stairs. She states at church recently someone told her she was wheezing. She was worried that her remote history of smoking may be contributing. She smokes a pack a week for one year over 30 years ago. No history of asthma or COPD.  Assessment/plan: Given her history and cardiac risk factors, I suspect cardiac etiology. Will obtain an echocardiogram. Do not suspect underlying pulm etiology given normal lung examination and such a remote history of smoking.  - Follow up echocardiogram

## 2021-05-07 NOTE — Assessment & Plan Note (Addendum)
Patient has known OA of bilateral knees. She is having L>R knee pain as well as ankle pain. Denies any injuries or trauma. Has had a steroid injection to the right knee in the past and states this helped. Prefers conservative management at this time. She feels her knee is swollen.  On exam, no swelling appreciated. No warmth or erythema. Anterior and posterior drawer tests negative. They did induce pain in the inferolateral portion of the knee. Mcmurrays test negative.   Assessment/plan:  History is consistent with OA. Will obtain updated knee radiographs and recommend PT and voltaren gel. May consider steroid injection if pain persists or worsens.  - Follow up left knee radiographs  - Voltaren gel prn - Referral to PT

## 2021-05-07 NOTE — Assessment & Plan Note (Signed)
Vitals:   05/07/21 1051  BP: (!) 153/95  Pulse: 98  Temp: 98.2 F (36.8 C)  SpO2: 98%   BP elevated today however patient did not take her antihypertensive medication today. She is tolerating amlodipine 10 mg. She has a BP cuff at home but has not been checking her pressures. Advised her to check at home when she takes her medications and to keep a log a few times a week so we can determine if she is on an appropriate regimen. She does have intermittent leg swelling, none at this time. Can consider switching agents if she continues to have swelling.  - Continue amlodipine 10 mg daily - Recommended she keep a home BP log a few times a week

## 2021-05-07 NOTE — Assessment & Plan Note (Signed)
Mammogram ordered

## 2021-05-07 NOTE — Progress Notes (Signed)
° °  CC: HTN, knee pain  HPI:  [name] is a 66 y.o. with a PMHx listed below presenting for follow up evaluation of her blood pressure and knee pain. For details of today's visit and the status of his chronic medical issues please refer to the assessment and plan.   Past Medical History:  Diagnosis Date   Arthritis    Chest pain    Health maintenance examination    Healthcare maintenance    Hyperlipidemia    Hypertension    Low back pain    Obesity    Osteoarthritis    Other screening mammogram    Scoliosis    Tendonitis, Achilles, right    Wrist pain    Review of Systems:   Review of Systems  Respiratory:  Positive for shortness of breath and wheezing.   Cardiovascular:  Positive for leg swelling. Negative for chest pain.  Musculoskeletal:  Positive for back pain and joint pain. Negative for falls.  Neurological:  Negative for dizziness and headaches.    Physical Exam:  Vitals:   05/07/21 1051  BP: (!) 153/95  Pulse: 98  Temp: 98.2 F (36.8 C)  TempSrc: Oral  SpO2: 98%  Weight: 222 lb 8 oz (100.9 kg)  Height: 5\' 2"  (1.575 m)    Physical Exam General: alert, appears stated age, in no acute distress HEENT: Normocephalic, atraumatic, EOM intact, conjunctiva normal CV: Regular rate and rhythm, no murmurs rubs or gallops, no JVD  Pulm: Clear to auscultation bilaterally, normal work of breathing Abdomen: Soft, nondistended, bowel sounds present, no tenderness to palpation MSK: No lower extremity edema, tenderness of inferolateral knee on ROM testing Skin: Warm and dry Neuro: Alert and oriented x3   Assessment & Plan:   See Encounters Tab for problem based charting.  Patient discussed with Dr.  

## 2021-05-07 NOTE — Patient Instructions (Signed)
It was a pleasure meeting you today!  We discussed the following:   For your shortness of breath, I am ordering an ultrasound of your heart to further evaluate. For your knee pain I have sent in a script for voltaren gel, a referral to physical therapy and also ordered xrays. For your blood pressure, try to check this a few times at home and keep a log. If your pressures are >140 I want you to give me a call   Plan to follow up in about 3 months!  Thanks for allowing [place] to be a part of your care!

## 2021-05-08 ENCOUNTER — Other Ambulatory Visit: Payer: Self-pay | Admitting: Student

## 2021-05-08 DIAGNOSIS — J309 Allergic rhinitis, unspecified: Secondary | ICD-10-CM

## 2021-05-08 NOTE — Progress Notes (Signed)
Internal Medicine Clinic Attending ° °Case discussed with Dr. Rehman  At the time of the visit.  We reviewed the resident’s history and exam and pertinent patient test results.  I agree with the assessment, diagnosis, and plan of care documented in the resident’s note.  ° °

## 2021-05-25 NOTE — Therapy (Incomplete)
OUTPATIENT PHYSICAL THERAPY LOWER EXTREMITY EVALUATION   Patient Name: Tiffany Pacheco MRN: [id number] DOB:10/28/1955, 66 y.o., female Today's Date: 05/25/2021    Past Medical History:  Diagnosis Date   Arthritis    Chest pain    Health maintenance examination    Healthcare maintenance    Hyperlipidemia    Hypertension    Low back pain    Obesity    Osteoarthritis    Other screening mammogram    Scoliosis    Tendonitis, Achilles, right    Wrist pain    Past Surgical History:  Procedure Laterality Date   BREAST LUMPECTOMY     TUBAL LIGATION     Patient Active Problem List   Diagnosis Date Noted   Dyspnea on exertion 05/07/2021   Lower extremity edema 11/12/2020   Encounter for screening mammogram for malignant neoplasm of breast 03/22/2019   Colon cancer screening 03/22/2019   Cervical cancer screening 03/22/2019   Urge incontinence 08/30/2018   BMI 40.0-44.9, adult (HCC) 04/04/2013   Insomnia 12/20/2012   Chronic allergic rhinitis 03/20/2012   GERD (gastroesophageal reflux disease) 07/19/2011   CMC arthritis, thumb, degenerative 03/29/2011   Hyperlipidemia 01/26/2011   Preventative health care 01/26/2011   Low back pain 01/08/2011   Hypertension 11/25/2010   Scoliosis 11/25/2010   Osteoarthritis 11/25/2010    PCP: Dellis Filbert, MD  REFERRING PROVIDER: Dickie La, MD  REFERRING DIAG: M17.0 (ICD-10-CM) - Primary osteoarthritis of both knees  THERAPY DIAG:  No diagnosis found.  ONSET DATE: ***  SUBJECTIVE:   SUBJECTIVE STATEMENT: ***  PERTINENT HISTORY: ***  PAIN:  Are you having pain? {yes/no:20286} NPRS scale: ***/10 Pain location: *** Pain orientation: {Pain Orientation:25161}  PAIN TYPE: {type:313116} Pain description: {PAIN DESCRIPTION:21022940}  Aggravating factors: *** Relieving factors: ***  PRECAUTIONS: {Therapy precautions:24002}  WEIGHT BEARING RESTRICTIONS {Yes ***/No:24003}  FALLS:  Has patient fallen in last 6 months?  {yes/no:20286}, Number of falls: ***  LIVING ENVIRONMENT: Lives with: {[facility] lives with:25569::"lives with their family"} Lives in: {Lives in:25570} Stairs: {yes/no:20286}; {Stairs:24000} Has following equipment at home: {Assistive devices:23999}  OCCUPATION: ***  PLOF: {PLOF:24004}  PATIENT GOALS ***   OBJECTIVE:   DIAGNOSTIC FINDINGS: ***  PATIENT SURVEYS:  {rehab surveys:24030}  COGNITION:  Overall cognitive status: {cognition:24006}     SENSATION:  Light touch: {intact/deficits:24005}  Stereognosis: {intact/deficits:24005}  Hot/Cold: {intact/deficits:24005}  Proprioception: {intact/deficits:24005}  MUSCLE LENGTH: Hamstrings: Right *** deg; Left *** deg Thomas test: Right *** deg; Left *** deg  POSTURE:  ***  PALPATION: ***  LE AROM/PROM:  A/PROM Right 05/25/2021 Left 05/25/2021  Hip flexion    Hip extension    Hip abduction    Hip adduction    Hip internal rotation    Hip external rotation    Knee flexion    Knee extension    Ankle dorsiflexion    Ankle plantarflexion    Ankle inversion    Ankle eversion     (Blank rows = not tested)  LE MMT:  MMT Right 05/25/2021 Left 05/25/2021  Hip flexion    Hip extension    Hip abduction    Hip adduction    Hip internal rotation    Hip external rotation    Knee flexion    Knee extension    Ankle dorsiflexion    Ankle plantarflexion    Ankle inversion    Ankle eversion     (Blank rows = not tested)  LOWER EXTREMITY SPECIAL TESTS:  {LEspecialtests:26242}  FUNCTIONAL TESTS:  {Functional tests:24029}  GAIT: Distance walked: *** Assistive device utilized: {Assistive devices:23999} Level of assistance: {Levels of assistance:24026} Comments: ***    TODAY'S TREATMENT: ***   PATIENT EDUCATION:  Education details: *** Person educated: {Person educated:25204} Education method: {Education Method:25205} Education comprehension: {Education Comprehension:25206}   HOME EXERCISE  PROGRAM: ***  ASSESSMENT:  CLINICAL IMPRESSION: Patient is a *** y.o. *** who was seen today for physical therapy evaluation and treatment for ***. Objective impairments include {opptimpairments:25111}. These impairments are limiting patient from {activity limitations:25113}. Personal factors including {Personal factors:25162} are also affecting patient's functional outcome. Patient will benefit from skilled PT to address above impairments and improve overall function.  REHAB POTENTIAL: {rehabpotential:25112}  CLINICAL DECISION MAKING: {clinical decision making:25114}  EVALUATION COMPLEXITY: {Evaluation complexity:25115}   GOALS: Goals reviewed with patient? {yes/no:20286}  SHORT TERM GOALS:  STG Name Target Date Goal status  1 *** Baseline:  {follow up:25551} {GOALSTATUS:25110}  2 *** Baseline:  {follow up:25551} {GOALSTATUS:25110}  3 *** Baseline: {follow up:25551} {GOALSTATUS:25110}  4 *** Baseline: {follow up:25551} {GOALSTATUS:25110}  5 *** Baseline: {follow up:25551} {GOALSTATUS:25110}  6 *** Baseline: {follow up:25551} {GOALSTATUS:25110}  7 *** Baseline: {follow up:25551} {GOALSTATUS:25110}   LONG TERM GOALS:   LTG Name Target Date Goal status  1 *** Baseline: {follow up:25551} {GOALSTATUS:25110}  2 *** Baseline: {follow up:25551} {GOALSTATUS:25110}  3 *** Baseline: {follow up:25551} {GOALSTATUS:25110}  4 *** Baseline: {follow up:25551} {GOALSTATUS:25110}  5 *** Baseline: {follow up:25551} {GOALSTATUS:25110}  6 *** Baseline: {follow up:25551} {GOALSTATUS:25110}  7 *** Baseline: {follow up:25551} {GOALSTATUS:25110}   PLAN: PT FREQUENCY: {rehab frequency:25116}  PT DURATION: {rehab duration:25117}  PLANNED INTERVENTIONS: {rehab planned interventions:25118::"Therapeutic exercises","Therapeutic activity","Neuro Muscular re-education","Balance training","Gait training","Patient/Family education","Joint mobilization"}  PLAN FOR NEXT SESSION:  Eloy End, PT 05/25/2021, 5:18 PM

## 2021-05-26 ENCOUNTER — Ambulatory Visit: Payer: Medicaid Other | Attending: Internal Medicine

## 2021-06-02 ENCOUNTER — Ambulatory Visit: Payer: Medicaid Other

## 2021-06-29 DIAGNOSIS — R32 Unspecified urinary incontinence: Secondary | ICD-10-CM | POA: Diagnosis not present

## 2021-06-29 DIAGNOSIS — I10 Essential (primary) hypertension: Secondary | ICD-10-CM | POA: Diagnosis not present

## 2021-07-13 ENCOUNTER — Ambulatory Visit (HOSPITAL_COMMUNITY): Admission: RE | Admit: 2021-07-13 | Payer: Medicaid Other | Source: Ambulatory Visit

## 2021-07-31 DIAGNOSIS — R32 Unspecified urinary incontinence: Secondary | ICD-10-CM | POA: Diagnosis not present

## 2021-07-31 DIAGNOSIS — I10 Essential (primary) hypertension: Secondary | ICD-10-CM | POA: Diagnosis not present

## 2021-08-06 ENCOUNTER — Ambulatory Visit (HOSPITAL_COMMUNITY): Admission: RE | Admit: 2021-08-06 | Payer: Medicaid Other | Source: Ambulatory Visit

## 2021-08-20 ENCOUNTER — Ambulatory Visit (HOSPITAL_COMMUNITY): Admission: RE | Admit: 2021-08-20 | Payer: Medicaid Other | Source: Ambulatory Visit

## 2021-09-01 ENCOUNTER — Encounter: Payer: Self-pay | Admitting: Internal Medicine

## 2021-09-01 ENCOUNTER — Ambulatory Visit (INDEPENDENT_AMBULATORY_CARE_PROVIDER_SITE_OTHER): Payer: Medicaid Other | Admitting: Internal Medicine

## 2021-09-01 DIAGNOSIS — M1712 Unilateral primary osteoarthritis, left knee: Secondary | ICD-10-CM

## 2021-09-01 DIAGNOSIS — I10 Essential (primary) hypertension: Secondary | ICD-10-CM

## 2021-09-01 DIAGNOSIS — M170 Bilateral primary osteoarthritis of knee: Secondary | ICD-10-CM

## 2021-09-01 NOTE — Patient Instructions (Signed)
Thank you, Tiffany Pacheco for allowing [place] to provide your care today. Today we discussed your knee pain and you received a steroid injection in the left knee today.   ? ?I have ordered the following labs for you: ? ?None ? ?Tests ordered today: ? ?None ? ?Referrals ordered today:  ? ?None ? ?  ? ?Follow up: 3 months  ? ?Remember: Continue taking your medications as directed. No extra activity today and tomorrow since you received your steroid injection. Light walking is acceptable. ? ?Should you have any questions or concerns please call the internal medicine clinic at [phone].   ? ?Dellis Filbert, MD ?[facility] ?  ?

## 2021-09-01 NOTE — Assessment & Plan Note (Addendum)
Prior office visit, patient complained of bilateral knee pain with left > right pain.  She denied any injuries or trauma to the knees.  In the past, she has received steroid injection in the right knee with moderate relief.  Left knee radiographs were obtained and showed moderate lateral and mild medial compartment joint space narrowing and peripheral osteophytosis consistent with osteoarthritis. Voltaren gel as needed QID was advised along with referral for physical therapy. ?Today, patient is still complaining of left knee pain.  She has been taking Tylenol arthritis with minimal relief and using the Voltaren gel 4 times a day with minimal relief.  She walks daily for about 30 minutes before she feels like her knee is going to give out.  Years ago she had steroid injection of the right knee and endorsed moderate relief and is interested in receiving steroid injection in the left knee.  She is not interested in physical therapy at this time.  She has gone to physical therapy years ago and states the task will be too tedious for her current knee pain. ? ?P: ?-steroid injection of the left knee ? ?

## 2021-09-01 NOTE — Assessment & Plan Note (Addendum)
Home medication includes amlodipine 10 mg daily and lisinopril 20 mg daily.  Patient was last seen in office January 2023 and her pressures were slightly elevated due to her not taking her medications prior to the office visit.  She complained of intermittent leg swelling during that visit.  Today, she seems to be tolerating the medication well and denies any lower extremity swelling at this time.  Her blood pressure was 131/98 during this visit and she states she had not taken her morning medications.  Her pressures were not too concerning during this visit and no dose change indicated at this time. ? ?P: ?-Continue amlodipine 10 mg daily and lisinopril 20 mg daily ?

## 2021-09-01 NOTE — Progress Notes (Addendum)
? ?CC: Blood pressure check, left knee pain ? ?HPI: ? ?Tiffany Pacheco is a 66 y.o. female with a past medical history stated below and presents today for CC listed above. Please see problem based assessment and plan for additional details. ? ?Past Medical History:  ?Diagnosis Date  ? Arthritis   ? Chest pain   ? Health maintenance examination   ? Healthcare maintenance   ? Hyperlipidemia   ? Hypertension   ? Low back pain   ? Obesity   ? Osteoarthritis   ? Other screening mammogram   ? Scoliosis   ? Tendonitis, Achilles, right   ? Wrist pain   ? ? ?Current Outpatient Medications on File Prior to Visit  ?Medication Sig Dispense Refill  ? acetaminophen (TYLENOL) 325 MG tablet Take 1-2 tablets (325-650 mg total) by mouth every 8 (eight) hours as needed. (Patient not taking: Reported on 04/20/2016) 60 tablet 2  ? amLODipine (NORVASC) 5 MG tablet Take 1 tablet (5 mg total) by mouth daily. 90 tablet 1  ? atorvastatin (LIPITOR) 40 MG tablet Take 1 tablet (40 mg total) by mouth daily. 90 tablet 1  ? diclofenac Sodium (VOLTAREN) 1 % GEL Apply 2 g topically 4 (four) times daily. 4 g 2  ? fluticasone (FLONASE) 50 MCG/ACT nasal spray PLACE 1 SPRAY INTO BOTH NOSTRILS DAILY. 16 g 5  ? ibuprofen (ADVIL) 800 MG tablet Take 1 tablet (800 mg total) by mouth every 8 (eight) hours as needed for moderate pain. Take with food. 30 tablet 0  ? lisinopril (ZESTRIL) 20 MG tablet Take 1 tablet (20 mg total) by mouth daily. 90 tablet 1  ? loratadine (ALLERGY RELIEF) 10 MG tablet TAKE 1 TABLET (10 MG TOTAL) BY MOUTH DAILY. FOR ALLERGIES 90 tablet 3  ? pantoprazole (PROTONIX) 20 MG tablet Take 1 tablet (20 mg total) by mouth daily. 90 tablet 1  ? [DISCONTINUED] famotidine (PEPCID) 20 MG tablet Take 20 mg by mouth daily.      ? ?No current facility-administered medications on file prior to visit.  ? ? ?Family History  ?Problem Relation Age of Onset  ? Diabetes Mother   ? Hypertension Mother   ? Cancer Cousin   ? ? ?Social History   ? ?Socioeconomic History  ? Marital status: Divorced  ?  Spouse name: Not on file  ? Number of children: Not on file  ? Years of education: Not on file  ? Highest education level: Not on file  ?Occupational History  ? Not on file  ?Tobacco Use  ? Smoking status: Former  ?  Packs/day: 0.50  ?  Years: 2.00  ?  Pack years: 1.00  ?  Types: Cigarettes  ?  Quit date: 11/24/1988  ?  Years since quitting: 32.7  ? Smokeless tobacco: Never  ?Substance and Sexual Activity  ? Alcohol use: No  ? Drug use: No  ? Sexual activity: Not on file  ?Other Topics Concern  ? Not on file  ?Social History Narrative  ? Not on file  ? ?Social Determinants of Health  ? ?Financial Resource Strain: Not on file  ?Food Insecurity: Not on file  ?Transportation Needs: Not on file  ?Physical Activity: Not on file  ?Stress: Not on file  ?Social Connections: Not on file  ?Intimate Partner Violence: Not on file  ? ? ?Review of Systems: ?ROS negative except for what is noted on the assessment and plan. ? ?Vitals:  ? 09/01/21 1010 09/01/21 1023  ?BP: Marland Kitchen)  135/98 (!) 131/98  ?Pulse: 86 84  ?Temp: 98.9 ?F (37.2 ?C)   ?TempSrc: Oral   ?SpO2: 99%   ? ? ? ?Physical Exam: ?Constitutional: well-appearing obese woman sitting in the chair, in no acute distress ?HENT: normocephalic atraumatic, mucous membranes moist ?Eyes: conjunctiva non-erythematous ?Neck: supple ?Cardiovascular: regular rate and rhythm, no m/r/g ?Pulmonary/Chest: normal work of breathing on room air, lungs clear to auscultation bilaterally ?Abdominal: soft, non-tender, non-distended ?MSK: normal bulk and tone; tenderness and crepitus noted on extension of the left knee, no erythema, no swelling or no increased warmth compared to the right knee noted. ?Neurological: alert & oriented x 3, 5/5 strength in bilateral upper and lower extremities, abnormal gait ?Skin: warm and dry ?Psych: Normal mood, normal behavior ? ?Knee injection Procedure Note ? ?Tiffany Pacheco  ?[id number]   ?[birth year] ? ?Date:09/01/21  ?Time:2:52 PM  ? ?Provider Performing:Francene Mcerlean Dekendrick Uzelac  ? ?Procedure: Knee injection ? ?Indication(s) ?Osteoarthritis ? ?Consent ?Risks of the procedure as well as the alternatives and risks of each were explained to the patient and/or caregiver.  Consent for the procedure was obtained and is signed in the bedside chart ? ?Anesthesia ?Infiltrated 1% lidocaine ? ?Time Out ?Verified patient identification, verified procedure, site/side was marked, verified correct patient position, special equipment/implants available, medications/allergies/relevant history reviewed, required imaging and test results available. ? ?Procedure Description ?Patient lied flat on examination table with left knee bent at a 35 degree angle.  Local Betadine was applied to the superolateral region of the left knee.  Local anesthesia was injected into the site followed by steroid injection. ? ?Complications/Tolerance ?None; patient tolerated the procedure well. ? ? ?Dellis Filbert, MD ?Internal Medicine Resident ?Patient seen with Dr. Mayford Knife and Discussed with Dr. Mayford Knife ? ?

## 2021-09-02 DIAGNOSIS — I10 Essential (primary) hypertension: Secondary | ICD-10-CM | POA: Diagnosis not present

## 2021-09-02 DIAGNOSIS — R32 Unspecified urinary incontinence: Secondary | ICD-10-CM | POA: Diagnosis not present

## 2021-09-04 NOTE — Progress Notes (Signed)
Internal Medicine Clinic Attending ? ?I saw and evaluated the patient.  I personally confirmed the key portions of the history and exam documented by Dr. Jeanice Lim and I reviewed pertinent patient test results.  The assessment, diagnosis, and plan were formulated together and I agree with the documentation in the resident?s note. I supervised the knee injection (40 mg Kenalog/triamcinolone with 3 cc 1% lidocaine) accessing joint space via superolateral approach which was tolerated well and which provided immediate relief.   ?

## 2021-10-19 ENCOUNTER — Other Ambulatory Visit: Payer: Self-pay | Admitting: Student

## 2021-10-19 ENCOUNTER — Other Ambulatory Visit: Payer: Self-pay | Admitting: Internal Medicine

## 2021-10-19 DIAGNOSIS — M170 Bilateral primary osteoarthritis of knee: Secondary | ICD-10-CM

## 2021-10-19 DIAGNOSIS — N3941 Urge incontinence: Secondary | ICD-10-CM

## 2021-10-19 DIAGNOSIS — K219 Gastro-esophageal reflux disease without esophagitis: Secondary | ICD-10-CM

## 2021-11-16 DIAGNOSIS — R32 Unspecified urinary incontinence: Secondary | ICD-10-CM | POA: Diagnosis not present

## 2021-11-16 DIAGNOSIS — I10 Essential (primary) hypertension: Secondary | ICD-10-CM | POA: Diagnosis not present

## 2021-12-16 ENCOUNTER — Ambulatory Visit (INDEPENDENT_AMBULATORY_CARE_PROVIDER_SITE_OTHER): Payer: Medicaid Other | Admitting: Student

## 2021-12-16 DIAGNOSIS — N3941 Urge incontinence: Secondary | ICD-10-CM

## 2021-12-16 NOTE — Progress Notes (Addendum)
  [facility] Telephone Encounter Continuity Care Appointment  HPI:  This telephone encounter was created for [name] on 12/16/2021 for the following purpose/cc urge incontinence/DME paperwork.   Past Medical History:  Past Medical History:  Diagnosis Date   Arthritis    Chest pain    Health maintenance examination    Healthcare maintenance    Hyperlipidemia    Hypertension    Low back pain    Obesity    Osteoarthritis    Other screening mammogram    Scoliosis    Tendonitis, Achilles, right    Wrist pain      ROS:  Urinary incontinence   Assessment / Plan / Recommendations:  Please see A&P under problem oriented charting for assessment of the patient's acute and chronic medical conditions.  As always, pt is advised that if symptoms worsen or new symptoms arise, they should go to an urgent care facility or to to ER for further evaluation.   Consent and Medical Decision Making:  Patient discussed with Dr.  Sol Blazing This is a telephone encounter between [name] and Quincy Simmonds on 12/16/2021 for paperwork for incontinence suplies. The visit was conducted with the patient located at home and Quincy Simmonds at [facility]. The patient's identity was confirmed using their DOB and current address. The patient has consented to being evaluated through a telephone encounter and understands the associated risks (an examination cannot be done and the patient may need to come in for an appointment) / benefits (allows the patient to remain at home, decreasing exposure to coronavirus). I personally spent 11 minutes on medical discussion.

## 2021-12-16 NOTE — Assessment & Plan Note (Signed)
Telehealth visit for incontinence supplies. Using underwear, pads, and gloves. States she currently has many gloves and does need as many. Is not taking oxybutynin because, because she does not like taking medications unless absolutely necessary. Denies side effects to the medications. Feels incontinence supplies adequately address the issues and would like to continue with this. Will have paperwork completed so she can continue to receive supplies.

## 2021-12-17 NOTE — Progress Notes (Signed)
Internal Medicine Clinic Attending ? ?Case discussed with Dr. Liang  At the time of the visit.  We reviewed the resident?s history and exam and pertinent patient test results.  I agree with the assessment, diagnosis, and plan of care documented in the resident?s note. ? ?

## 2022-01-12 ENCOUNTER — Ambulatory Visit (INDEPENDENT_AMBULATORY_CARE_PROVIDER_SITE_OTHER): Payer: Medicaid Other | Admitting: Student

## 2022-01-12 VITALS — BP 134/93 | HR 95 | Temp 98.2°F | Wt 220.0 lb

## 2022-01-12 DIAGNOSIS — M170 Bilateral primary osteoarthritis of knee: Secondary | ICD-10-CM | POA: Diagnosis not present

## 2022-01-12 MED ORDER — DICLOFENAC SODIUM 75 MG PO TBEC: 75.0000 mg | DELAYED_RELEASE_TABLET | Freq: Two times a day (BID) | ORAL | 2 refills | Status: DC

## 2022-01-12 NOTE — Progress Notes (Signed)
Subjective:  Reason for visit: Knee osteoarthritis  HPI:  [name] is a 66 y.o. female with a past medical history stated below and presents today for knee osteoarthritis. Please see problem based assessment and plan for additional details.  Past Medical History:  Diagnosis Date   Arthritis    Chest pain    Health maintenance examination    Healthcare maintenance    Hyperlipidemia    Hypertension    Low back pain    Obesity    Osteoarthritis    Other screening mammogram    Scoliosis    Tendonitis, Achilles, right    Wrist pain     Current Outpatient Medications on File Prior to Visit  Medication Sig Dispense Refill   acetaminophen (TYLENOL) 325 MG tablet Take 1-2 tablets (325-650 mg total) by mouth every 8 (eight) hours as needed. (Patient not taking: Reported on 04/20/2016) 60 tablet 2   amLODipine (NORVASC) 5 MG tablet Take 1 tablet (5 mg total) by mouth daily. 90 tablet 1   atorvastatin (LIPITOR) 40 MG tablet Take 1 tablet (40 mg total) by mouth daily. 90 tablet 1   diclofenac Sodium (VOLTAREN) 1 % GEL APPLY 2 GRAMS TOPICALLY 4 (FOUR) TIMES DAILY. 400 g 0   fluticasone (FLONASE) 50 MCG/ACT nasal spray PLACE 1 SPRAY INTO BOTH NOSTRILS DAILY. 16 g 5   ibuprofen (ADVIL) 800 MG tablet Take 1 tablet (800 mg total) by mouth every 8 (eight) hours as needed for moderate pain. Take with food. 30 tablet 0   lisinopril (ZESTRIL) 20 MG tablet Take 1 tablet (20 mg total) by mouth daily. 90 tablet 1   loratadine (ALLERGY RELIEF) 10 MG tablet TAKE 1 TABLET (10 MG TOTAL) BY MOUTH DAILY. FOR ALLERGIES 90 tablet 3   oxybutynin (DITROPAN-XL) 10 MG 24 hr tablet TAKE 1 TABLET (10 MG TOTAL) BY MOUTH DAILY. 90 tablet 0   pantoprazole (PROTONIX) 20 MG tablet TAKE 1 TABLET (20 MG TOTAL) BY MOUTH DAILY. 90 tablet 1   [DISCONTINUED] famotidine (PEPCID) 20 MG tablet Take 20 mg by mouth daily.       No current facility-administered medications on file prior to visit.    Family History   Problem Relation Age of Onset   Diabetes Mother    Hypertension Mother    Cancer Cousin     Social History   Socioeconomic History   Marital status: Divorced    Spouse name: Not on file   Number of children: Not on file   Years of education: Not on file   Highest education level: Not on file  Occupational History   Not on file  Tobacco Use   Smoking status: Former    Packs/day: 0.50    Years: 2.00    Total pack years: 1.00    Types: Cigarettes    Quit date: 11/24/1988    Years since quitting: 33.1   Smokeless tobacco: Never  Substance and Sexual Activity   Alcohol use: No   Drug use: No   Sexual activity: Not on file  Other Topics Concern   Not on file  Social History Narrative   Not on file   Social Determinants of Health   Financial Resource Strain: Not on file  Food Insecurity: Not on file  Transportation Needs: Not on file  Physical Activity: Not on file  Stress: Not on file  Social Connections: Not on file  Intimate Partner Violence: Not on file    Review of Systems:  ROS negative except for what is noted on the assessment and plan.  Objective:   Vitals:   01/12/22 1017  BP: (!) 134/93  Pulse: 95  Temp: 98.2 F (36.8 C)  TempSrc: Oral  SpO2: 99%  Weight: 220 lb (99.8 kg)    Physical Exam: General: Well-appearing female. Pulmonary: Normal breathing. Skin: Warm and dry. Musculoskeletal: Crepitus on passive range of motion bilateral knees.  Tenderness along joint line bilateral knees.  No warmth, erythema or effusion in either knee. Neurologic: Alert and oriented. Psychiatric: Pleasant.  Appropriate mood and affect.   Assessment & Plan:  Osteoarthritis Patient complains of severe pain in bilateral knees left worse than right due to osteoarthritis.  This condition has been ongoing for several years.  She had a knee injection 4 months ago with significant relief.  She also takes Tylenol for knee pain.  Until recently she was feeling well but her  pain returned.  On physical exam both knees are without erythema, significant swelling, or effusion.  There is crepitus on passive range of motion in both knees.  She also has significant tenderness on joint line of both knees.  For this patient's left knee osteoarthritis I will perform an injection of intra-articular corticosteroids.  This gave her effective relief last time.  I also recommend starting diclofenac for when the pain is severe. - Start diclofenac 75 mg twice daily for knee osteoarthritis  PROCEDURE NOTE  PROCEDURE: left knee joint steroid injection.  PREOPERATIVE DIAGNOSIS: Osteoarthritis of the left knee.  POSTOPERATIVE DIAGNOSIS: Osteoarthritis of the left knee.  PROCEDURE: The patient was apprised of the risks and the benefits of the procedure and informed consent was obtained, as witnessed by Dr. Mikey Bussing. Time-out procedure was performed, with confirmation of the patient's name, date of birth, and correct identification of the left knee to be injected. The patient's knee was then marked at the appropriate site for injection placement. The knee was sterilely prepped with Betadine. A 40 mg (1 milliliter) solution of Kenalog was drawn up into a 5 mL syringe with a 2 mL of 1% lidocaine. The patient was injected with a 25-gauge needle at the lateral superior lateral aspect of her left patella. There were no complications. The patient tolerated the procedure well. There was minimal bleeding. The patient was instructed to ice her knee upon leaving clinic and refrain from overuse over the next 3 days. The patient was instructed to go to the emergency room with any usual pain, swelling, or redness occurred in the injected area. The patient was given a followup appointment to evaluate response to the injection to his increased range of motion and reduction of pain.  The procedure was supervised by attending physician, Dr. Mikey Bussing.  Patient seen with Dr. Elige Ko,  M.D. [facility]  PGY-1 Pager: [phone] Date 01/12/2022  Time 9:27 PM

## 2022-01-12 NOTE — Patient Instructions (Signed)
Thank you, Ms. [name] for allowing [place] to provide your care today. Today we discussed knee osteoarthritis.  Please take diclofenac, 2 tablets daily with food for knee pain. Stop taking this medicine if you experience stomach upset.    I have ordered the following medication/changed the following medications:   Stop the following medications: There are no discontinued medications.   Start the following medications: Meds ordered this encounter  Medications   diclofenac (VOLTAREN) 75 MG EC tablet    Sig: Take 1 tablet (75 mg total) by mouth 2 (two) times daily.    Dispense:  60 tablet    Refill:  2     Follow up: 4-6 months   We look forward to seeing you next time. Please call our clinic at [phone] if you have any questions or concerns. The best time to call is Monday-Friday from 9am-4pm, but there is someone available 24/7. If after hours or the weekend, call the main hospital number and ask for the Internal Medicine Resident On-Call. If you need medication refills, please notify your pharmacy one week in advance and they will send [place] a request.   Thank you for trusting me with your care. Wishing you the best!   Marrianne Mood, MD [facility]

## 2022-01-12 NOTE — Assessment & Plan Note (Signed)
Patient complains of severe pain in bilateral knees left worse than right due to osteoarthritis.  This condition has been ongoing for several years.  She had a knee injection 4 months ago with significant relief.  She also takes Tylenol for knee pain.  Until recently she was feeling well but her pain returned.  On physical exam both knees are without erythema, significant swelling, or effusion.  There is crepitus on passive range of motion in both knees.  She also has significant tenderness on joint line of both knees.  For this patient's left knee osteoarthritis I will perform an injection of intra-articular corticosteroids.  This gave her effective relief last time.  I also recommend starting diclofenac for when the pain is severe. - Start diclofenac 75 mg twice daily for knee osteoarthritis

## 2022-01-13 ENCOUNTER — Telehealth: Payer: Self-pay

## 2022-01-13 DIAGNOSIS — R32 Unspecified urinary incontinence: Secondary | ICD-10-CM | POA: Diagnosis not present

## 2022-01-13 DIAGNOSIS — I10 Essential (primary) hypertension: Secondary | ICD-10-CM | POA: Diagnosis not present

## 2022-01-13 NOTE — Progress Notes (Signed)
Internal Medicine Clinic Attending  I saw and evaluated the patient.  I personally confirmed the key portions of the history and exam documented by the resident  and I reviewed pertinent patient test results.  The assessment, diagnosis, and plan were formulated together and I agree with the documentation in the resident's note.  

## 2022-01-13 NOTE — Telephone Encounter (Signed)
Pa  for pt ( DICLOFENAC SOD  75 MG )  came through VIA fax from pharmacy ... Was submitted on cover my meds  with last office notes awaiting approval or denial ..      UPDATE :    [employer] is reviewing your PA request. Typically an electronic response will be received within 24-72 hours.

## 2022-01-18 NOTE — Telephone Encounter (Signed)
DECISION :     DENIED ;   One of the following:  (i) You have failed two preferred drugs as confirmed by claims history or submission of medical records: celecoxib, ibuprofen, indomethacin capsule, ketorolac tablet, meloxicam tablet, naproxen, naproxen delayed-release tablet and sulindac.  (ii) You cannot use two preferred drugs (please specify contraindication or intolerance).      ( I dont understand the results  because pt has tried and failed   and it was still denied even though I had put that she  did try and failed ... Maybe I can try again at a later date )

## 2022-01-26 ENCOUNTER — Other Ambulatory Visit: Payer: Self-pay

## 2022-01-26 DIAGNOSIS — M545 Low back pain, unspecified: Secondary | ICD-10-CM

## 2022-01-26 DIAGNOSIS — M170 Bilateral primary osteoarthritis of knee: Secondary | ICD-10-CM

## 2022-01-26 MED ORDER — IBUPROFEN 800 MG PO TABS: 800.0000 mg | ORAL_TABLET | Freq: Three times a day (TID) | ORAL | 0 refills | Status: DC | PRN

## 2022-02-15 DIAGNOSIS — R32 Unspecified urinary incontinence: Secondary | ICD-10-CM | POA: Diagnosis not present

## 2022-02-15 DIAGNOSIS — I10 Essential (primary) hypertension: Secondary | ICD-10-CM | POA: Diagnosis not present

## 2022-02-19 ENCOUNTER — Other Ambulatory Visit: Payer: Self-pay | Admitting: Student

## 2022-02-19 ENCOUNTER — Other Ambulatory Visit: Payer: Self-pay | Admitting: Internal Medicine

## 2022-02-19 DIAGNOSIS — J309 Allergic rhinitis, unspecified: Secondary | ICD-10-CM

## 2022-02-19 DIAGNOSIS — I10 Essential (primary) hypertension: Secondary | ICD-10-CM

## 2022-03-26 DIAGNOSIS — R32 Unspecified urinary incontinence: Secondary | ICD-10-CM | POA: Diagnosis not present

## 2022-03-26 DIAGNOSIS — I10 Essential (primary) hypertension: Secondary | ICD-10-CM | POA: Diagnosis not present

## 2022-05-06 ENCOUNTER — Encounter: Payer: Self-pay | Admitting: Internal Medicine

## 2022-05-06 ENCOUNTER — Ambulatory Visit (HOSPITAL_COMMUNITY)
Admission: RE | Admit: 2022-05-06 | Discharge: 2022-05-06 | Disposition: A | Discharge status: Home / Self Care | Payer: Medicaid Other | Source: Ambulatory Visit | Attending: Student in an Organized Health Care Education/Training Program | Admitting: Student in an Organized Health Care Education/Training Program

## 2022-05-06 ENCOUNTER — Ambulatory Visit: Payer: Medicaid Other | Admitting: Internal Medicine

## 2022-05-06 VITALS — BP 129/89 | HR 73 | Temp 98.1°F | Wt 222.1 lb

## 2022-05-06 DIAGNOSIS — M170 Bilateral primary osteoarthritis of knee: Secondary | ICD-10-CM

## 2022-05-06 DIAGNOSIS — M1711 Unilateral primary osteoarthritis, right knee: Secondary | ICD-10-CM | POA: Diagnosis present

## 2022-05-06 DIAGNOSIS — M25561 Pain in right knee: Secondary | ICD-10-CM | POA: Diagnosis not present

## 2022-05-06 LAB — SYNOVIAL CELL COUNT + DIFF, W/ CRYSTALS
Crystals, Fluid: NONE SEEN
Eosinophils-Synovial: 0 % (ref 0–1)
Lymphocytes-Synovial Fld: 89 % — ABNORMAL HIGH (ref 0–20)
Monocyte-Macrophage-Synovial Fluid: 5 % — ABNORMAL LOW (ref 50–90)
Neutrophil, Synovial: 6 % (ref 0–25)
WBC, Synovial: 80 /mm3 (ref 0–200)

## 2022-05-06 NOTE — Progress Notes (Signed)
CC: Knee injection  HPI:  [name] is a 67 y.o. with medical history of HTN, HLD, GERD, OA presenting to [facility] for knee injection. Pt's PCP is Dr. Cain Sieve and pt was last seen by Dr. Carin Primrose on 01/2022 where she received left knee injection.   Please see problem-based list for further details, assessments, and plans.  Past Medical History:  Diagnosis Date   Arthritis    Chest pain    Health maintenance examination    Healthcare maintenance    Hyperlipidemia    Hypertension    Low back pain    Obesity    Osteoarthritis    Other screening mammogram    Scoliosis    Tendonitis, Achilles, right    Wrist pain      Current Outpatient Medications (Cardiovascular):    amLODipine (NORVASC) 5 MG tablet, TAKE 1 TABLET (5 MG TOTAL) BY MOUTH DAILY.   atorvastatin (LIPITOR) 40 MG tablet, Take 1 tablet (40 mg total) by mouth daily.   lisinopril (ZESTRIL) 20 MG tablet, TAKE 1 TABLET (20 MG TOTAL) BY MOUTH DAILY.  Current Outpatient Medications (Respiratory):    fluticasone (FLONASE) 50 MCG/ACT nasal spray, PLACE 1 SPRAY INTO BOTH NOSTRILS DAILY.   loratadine (ALLERGY RELIEF) 10 MG tablet, TAKE 1 TABLET (10 MG TOTAL) BY MOUTH DAILY. FOR ALLERGIES  Current Outpatient Medications (Analgesics):    acetaminophen (TYLENOL) 325 MG tablet, Take 1-2 tablets (325-650 mg total) by mouth every 8 (eight) hours as needed. (Patient not taking: Reported on 04/20/2016)   diclofenac (VOLTAREN) 75 MG EC tablet, Take 1 tablet (75 mg total) by mouth 2 (two) times daily.   ibuprofen (ADVIL) 800 MG tablet, Take 1 tablet (800 mg total) by mouth every 8 (eight) hours as needed for moderate pain. Take with food.   Current Outpatient Medications (Other):    diclofenac Sodium (VOLTAREN) 1 % GEL, APPLY 2 GRAMS TOPICALLY 4 (FOUR) TIMES DAILY.   pantoprazole (PROTONIX) 20 MG tablet, TAKE 1 TABLET (20 MG TOTAL) BY MOUTH DAILY.  Review of Systems:  Review of system negative unless stated in the problem list or  HPI.    Physical Exam:  Vitals:   05/06/22 0957  BP: 129/89  Pulse: 73  Temp: 98.1 F (36.7 C)  TempSrc: Oral  SpO2: 96%  Weight: 222 lb 1.6 oz (100.7 kg)    Physical Exam General: NAD HENT: NCAT Lungs: CTAB, no wheeze, rhonchi or rales.  Cardiovascular: Normal heart sounds, no r/m/g, 2+ pulses in all extremities.  Abdomen: No TTP, normal bowel sounds MSK: TTP of right knee around joint line. Good ROM but pain at extremes of ROM. No gross effusion seen and knees are symmetric and without erythema.   Skin: no lesions noted on exposed skin Neuro: Alert and oriented x4. CN grossly intact Psych: Normal mood and normal affect   Assessment & Plan:   Osteoarthritis Pt has bilateral knee pain that has been bothering her for over 2 years. Last year x ray showed mod to severe OA on left knee. Has been getting knee injections in both knees. One in right about in 2019 and 2 in the left with the last one 3 months ago. Takes advil and has been taking it regularly for last week for right knee pain. Uses voltaren gel and it helps some. She inquired about gel injection and was advised on the lack of evidence to show their efficacy. On exam, pt's knee is exquisitely tender along the joint line. Pt had normal  range of motion of the knee with pain at extremes of movements. No gross effusion noted on physical exam and both knees appeared symmetric. On ultrasound of the right knee moderate amount of effusion was seen which was aspirated (see procedure note). Cytology without sign of infection or gout. Steroid injection was administered. Since there was no prior plain radiography, xray was ordered for the right knee which showed mild to moderate tricompartmental OA. Advised pt weight loss will help with the pain and losing 10-15 lbs will be required before joint replacement can be considered in her.  -Continue supportive therapies -Return in one month for injection of left knee if needed -Work on lifestyle  modification to achieved weight loss to alleviate knee pain and get evaluated for knee replacement in the future.    Procedure Note:  Procedure: Aspiration of right knee and steroid knee injection of knee Indication: OA Performing provider: Idamae Schuller, MD Supervising Physician: Dr. Evette Doffing, MD Consent obtained.   Procedure: Ultrasound was used to detect knee effusion. Moderate knee effusion was seen which was aspirated and sent for analysis for cell count and crystals. The anterolateral edge of the patella was marked to identify insertion point. The area was prepped in the usual sterile manner. Then a 25-g needle was inserted into the marked insertion point and advanced to a depth of ~3cm. Then a combination of 2cc's of 1% lidocaine and 1cc of 20mg /cc of kenalog was injected without resistance or patient discomfort. The needle was then removed and a bandaid applied over the insertion site.    Follow-up: Patient tolerated the procedure well without complications. Standard post-procedure care and return precautions were provided.    See Encounters Tab for problem based charting.  Patient discussed with Dr. Edrick Kins, MD [name]. [facility] Internal Medicine Residency, PGY-2

## 2022-05-06 NOTE — Patient Instructions (Addendum)
Tiffany Pacheco, it was a pleasure seeing you today! You endorsed feeling well today. Below are some of the things we talked about this visit. We look forward to seeing you in the follow up appointment!  Today we discussed: You received a knee injection. We will get lab work on the fluid analyzed today. We will call you with the result. Please get x ray of the right knee done today.  We can wait on orthopedic referral as pts need to have BMI less than 40 so you will need to lose some weight before this can be done. Work on diet and exercise.   I have ordered the following labs today:  Lab Orders         Cell count + diff,  w/ cryst-synvl fld       Referrals ordered today:   Referral Orders  No referral(s) requested today     I have ordered the following medication/changed the following medications:   Stop the following medications: There are no discontinued medications.   Start the following medications: No orders of the defined types were placed in this encounter.    Follow-up: follow up in one month for knee injection   Please make sure to arrive 15 minutes prior to your next appointment. If you arrive late, you may be asked to reschedule.   We look forward to seeing you next time. Please call our clinic at [phone] if you have any questions or concerns. The best time to call is Monday-Friday from 9am-4pm, but there is someone available 24/7. If after hours or the weekend, call the main hospital number and ask for the Internal Medicine Resident On-Call. If you need medication refills, please notify your pharmacy one week in advance and they will send [place] a request.  Thank you for letting us take part in your care. Wishing you the best!  Thank you, Idamae Schuller, MD

## 2022-05-08 NOTE — Assessment & Plan Note (Addendum)
Pt has bilateral knee pain that has been bothering her for over 2 years. Last year x ray showed mod to severe OA on left knee. Has been getting knee injections in both knees. One in right about in 2019 and 2 in the left with the last one 3 months ago. Takes advil and has been taking it regularly for last week for right knee pain. Uses voltaren gel and it helps some. She inquired about gel injection and was advised on the lack of evidence to show their efficacy. On exam, pt's knee is exquisitely tender along the joint line. Pt had normal range of motion of the knee with pain at extremes of movements. No gross effusion noted on physical exam and both knees appeared symmetric. On ultrasound of the right knee moderate amount of effusion was seen which was aspirated (see procedure note). Cytology without sign of infection or gout. Steroid injection was administered. Since there was no prior plain radiography, xray was ordered for the right knee which showed mild to moderate tricompartmental OA. Advised pt weight loss will help with the pain and losing 10-15 lbs will be required before joint replacement can be considered in her.  -Continue supportive therapies -Return in one month for injection of left knee if needed -Work on lifestyle modification to achieved weight loss to alleviate knee pain and get evaluated for knee replacement in the future.

## 2022-05-10 NOTE — Progress Notes (Signed)
Internal Medicine Clinic Attending  I saw and evaluated the patient.  I personally confirmed the key portions of the history and exam documented by Dr. Humphrey Rolls and I reviewed pertinent patient test results.  The assessment, diagnosis, and plan were formulated together and I agree with the documentation in the resident's note. I was present for the entirety of the procedure.

## 2022-05-11 DIAGNOSIS — I10 Essential (primary) hypertension: Secondary | ICD-10-CM | POA: Diagnosis not present

## 2022-05-11 DIAGNOSIS — R32 Unspecified urinary incontinence: Secondary | ICD-10-CM | POA: Diagnosis not present

## 2022-05-13 ENCOUNTER — Other Ambulatory Visit: Payer: Self-pay | Admitting: Internal Medicine

## 2022-05-13 DIAGNOSIS — J309 Allergic rhinitis, unspecified: Secondary | ICD-10-CM

## 2022-05-13 DIAGNOSIS — N3941 Urge incontinence: Secondary | ICD-10-CM

## 2022-07-15 ENCOUNTER — Other Ambulatory Visit: Payer: Self-pay | Admitting: Internal Medicine

## 2022-07-15 DIAGNOSIS — M170 Bilateral primary osteoarthritis of knee: Secondary | ICD-10-CM

## 2022-08-13 ENCOUNTER — Ambulatory Visit (INDEPENDENT_AMBULATORY_CARE_PROVIDER_SITE_OTHER): Payer: Medicare Other | Admitting: Student

## 2022-08-13 VITALS — BP 140/78 | HR 98 | Temp 98.2°F | Wt 211.3 lb

## 2022-08-13 DIAGNOSIS — R7303 Prediabetes: Secondary | ICD-10-CM | POA: Insufficient documentation

## 2022-08-13 DIAGNOSIS — M170 Bilateral primary osteoarthritis of knee: Secondary | ICD-10-CM | POA: Diagnosis not present

## 2022-08-13 DIAGNOSIS — I10 Essential (primary) hypertension: Secondary | ICD-10-CM | POA: Diagnosis not present

## 2022-08-13 LAB — POCT GLYCOSYLATED HEMOGLOBIN (HGB A1C): Hemoglobin A1C: 5.9 % — AB (ref 4.0–5.6)

## 2022-08-13 LAB — GLUCOSE, CAPILLARY: Glucose-Capillary: 133 mg/dL — ABNORMAL HIGH (ref 70–99)

## 2022-08-13 NOTE — Patient Instructions (Addendum)
Tiffany Pacheco,  It was a pleasure seeing you in the clinic today.   You received a steroid injection of your right knee today. I have placed a referral to the orthopedic doctors to further evaluate your left knee. They will be in contact with you to schedule an appointment. Your 47-month glucose control is great, keep up the great work! Please come back to see [place] in 1 month.  Please call our clinic at [phone] if you have any questions or concerns. The best time to call is Monday-Friday from 9am-4pm, but there is someone available 24/7 at the same number. If you need medication refills, please notify your pharmacy one week in advance and they will send [place] a request.   Thank you for letting us take part in your care. We look forward to seeing you next time!

## 2022-08-13 NOTE — Assessment & Plan Note (Addendum)
Patient with A1c of 6.3% in 10/2020, consistent with prediabetes. Has required several steroid injections for osteoarthritis since that time. A1c rechecked today, 5.9%.  Plan: -continue lifestyle modifications -repeat A1c in 6-12 months

## 2022-08-13 NOTE — Assessment & Plan Note (Addendum)
Patient with long-standing OA of bilateral knees. Knee films of R knee revealed mild-to-moderate tricompartmental OA. L knee films 1 year ago revealed moderate-to-sever lateral compartment and patellofemoral OA. She received a joint aspiration and steroid injection of R knee 3 months. She states that this alleviated her pain and allowed her to become more functional. However, pain recurred about 2 weeks ago and has progressively worsened, again now limiting her functionality. Her L knee does not appear to be amenable to steroid injection given degree of OA. She has lost about 11 lbs since last visit 3 months ago.  On exam, no significant effusion, erythema, or warmth noted to R knee. She does have mild TTP of the medial aspect of R knee. She does have limited ROM and pain at extremes of flexion and extension.  Discussed risks of steroid injection and obtained informed consent. Performed R knee steroid injection using medial approach. She tolerated the procedure well and experienced quick relief of knee pain, able to ambulate with less difficulty.  Discussed utility of orthopedic surgery referral for further evaluation of L knee to see if she would be a candidate for surgical intervention. She is interested in this so will place orthopedic referral. Given need for repeat corticosteroid injections and prediabetic history, repeated A1c today and she remains prediabetic.  Plan: -R knee steroid injection performed today -orthopedic referral placed -continue lifestyle modifications for further weight loss (if diabetic, can consider GLP-1a to help with this) -f/u in 1 month with PCP

## 2022-08-13 NOTE — Progress Notes (Signed)
   CC: f/u OA, prediabetes  HPI:  [name] is a 67 y.o. female with history listed below presenting to the [facility] for f/u OA, prediabetes. Please see individualized problem based charting for full HPI.  Past Medical History:  Diagnosis Date   Arthritis    Chest pain    Health maintenance examination    Healthcare maintenance    Hyperlipidemia    Hypertension    Low back pain    Obesity    Osteoarthritis    Other screening mammogram    Scoliosis    Tendonitis, Achilles, right    Wrist pain     Review of Systems:  Negative aside from that listed in individualized problem based charting.  Physical Exam:  Vitals:   08/13/22 0901  BP: (!) 140/78  Pulse: 98  Temp: 98.2 F (36.8 C)  TempSrc: Oral  SpO2: 98%  Weight: 211 lb 4.8 oz (95.8 kg)   Physical Exam Constitutional:      Appearance: Normal appearance. She is obese. She is not ill-appearing.  HENT:     Mouth/Throat:     Mouth: Mucous membranes are moist.     Pharynx: Oropharynx is clear. No oropharyngeal exudate.  Eyes:     General: No scleral icterus.    Extraocular Movements: Extraocular movements intact.     Conjunctiva/sclera: Conjunctivae normal.     Pupils: Pupils are equal, round, and reactive to light.  Cardiovascular:     Rate and Rhythm: Normal rate and regular rhythm.     Heart sounds: Normal heart sounds. No murmur heard.    No friction rub. No gallop.  Pulmonary:     Effort: Pulmonary effort is normal.     Breath sounds: Normal breath sounds. No wheezing, rhonchi or rales.  Abdominal:     General: Bowel sounds are normal. There is no distension.     Palpations: Abdomen is soft.     Tenderness: There is no abdominal tenderness. There is no guarding or rebound.  Musculoskeletal:     Comments: R knee with no significant erythema, effusion, or warmth. Mild TTP of R medial knee without any crepitus noted. Mildly limited ROM in extremes of flexion and extension.  Skin:    General: Skin is warm  and dry.  Neurological:     General: No focal deficit present.     Mental Status: She is alert and oriented to person, place, and time.  Psychiatric:        Mood and Affect: Mood normal.        Behavior: Behavior normal.      Assessment & Plan:   See Encounters Tab for problem based charting.  Patient seen with Dr. Oswaldo Done   Procedure - R knee steroid injection for Osteoarthritis: A steroid injection was performed at the R knee (using medial approach) using 1% plain Lidocaine and 20 mg of Kenalog. This was well tolerated.

## 2022-08-13 NOTE — Assessment & Plan Note (Signed)
Patient living with HTN, currently on norvasc 5mg  daily and lisinopril 20mg  daily. BP 140/78 in clinic today. She has not yet taken her home antihypertensive medications so will avoid making any adjustments at this time.   Plan: -continue norvasc 5mg  daily -continue lisinopril 20mg  daily

## 2022-08-16 NOTE — Progress Notes (Signed)
Internal Medicine Clinic Attending  I saw and evaluated the patient.  I personally confirmed the key portions of the history and exam documented by Dr. Jinwala and I reviewed pertinent patient test results.  The assessment, diagnosis, and plan were formulated together and I agree with the documentation in the resident's note. I was present for the entirety of the procedure. 

## 2022-08-24 ENCOUNTER — Other Ambulatory Visit: Payer: Self-pay | Admitting: Internal Medicine

## 2022-08-24 DIAGNOSIS — K219 Gastro-esophageal reflux disease without esophagitis: Secondary | ICD-10-CM

## 2022-08-24 DIAGNOSIS — I10 Essential (primary) hypertension: Secondary | ICD-10-CM

## 2022-08-24 NOTE — Telephone Encounter (Signed)
Med refill for Oxybutynin chloride (med expired off med list)

## 2022-08-25 ENCOUNTER — Ambulatory Visit (INDEPENDENT_AMBULATORY_CARE_PROVIDER_SITE_OTHER): Payer: Medicare Other | Admitting: Orthopaedic Surgery

## 2022-08-25 ENCOUNTER — Other Ambulatory Visit (INDEPENDENT_AMBULATORY_CARE_PROVIDER_SITE_OTHER): Payer: Medicare Other

## 2022-08-25 ENCOUNTER — Encounter: Payer: Self-pay | Admitting: Orthopaedic Surgery

## 2022-08-25 ENCOUNTER — Telehealth: Payer: Self-pay

## 2022-08-25 VITALS — Ht 63.0 in | Wt 212.0 lb

## 2022-08-25 DIAGNOSIS — M25562 Pain in left knee: Secondary | ICD-10-CM

## 2022-08-25 DIAGNOSIS — G8929 Other chronic pain: Secondary | ICD-10-CM

## 2022-08-25 MED ORDER — LIDOCAINE HCL 1 % IJ SOLN
2.0000 mL | INTRAMUSCULAR | Status: AC | PRN
Start: 2022-08-25 — End: 2022-08-25
  Administered 2022-08-25: 2 mL

## 2022-08-25 MED ORDER — OXYBUTYNIN CHLORIDE 5 MG PO TABS: 5.0000 mg | ORAL_TABLET | Freq: Three times a day (TID) | ORAL | 3 refills | Status: AC | PRN

## 2022-08-25 MED ORDER — BUPIVACAINE HCL 0.5 % IJ SOLN
2.0000 mL | INTRAMUSCULAR | Status: AC | PRN
Start: 2022-08-25 — End: 2022-08-25
  Administered 2022-08-25: 2 mL via INTRA_ARTICULAR

## 2022-08-25 MED ORDER — AMLODIPINE BESYLATE 5 MG PO TABS: 5.0000 mg | ORAL_TABLET | Freq: Every day | ORAL | 1 refills | Status: DC

## 2022-08-25 MED ORDER — METHYLPREDNISOLONE ACETATE 40 MG/ML IJ SUSP
40.0000 mg | INTRAMUSCULAR | Status: AC | PRN
Start: 2022-08-25 — End: 2022-08-25
  Administered 2022-08-25: 40 mg via INTRA_ARTICULAR

## 2022-08-25 NOTE — Telephone Encounter (Signed)
VOB submitted for Monovisc, bilateral knee  

## 2022-08-25 NOTE — Telephone Encounter (Signed)
Please precert for bilateral gel injections. Dr.Xu's patient.  

## 2022-08-25 NOTE — Progress Notes (Signed)
Office Visit Note   Patient: Tiffany Pacheco           Date of Birth: 05-Jan-1956           MRN: [id number] Visit Date: 08/25/2022              Requested by: Tyson Alias, MD [address] [place],  [place] [address] PCP: Mercie Eon, MD   Assessment & Plan: Visit Diagnoses:  1. Chronic pain of left knee     Plan: Impression is left knee DJD.  She would like to repeat steroid injection today.  She's had good relief in the right knee from steroid injection.  Will also obtain authorization for visco.    This patient is diagnosed with osteoarthritis of the knee(s).    Radiographs show evidence of joint space narrowing, osteophytes, subchondral sclerosis and/or subchondral cysts.  This patient has knee pain which interferes with functional and activities of daily living.    This patient has experienced inadequate response, adverse effects and/or intolerance with conservative treatments such as acetaminophen, NSAIDS, topical creams, physical therapy or regular exercise, knee bracing and/or weight loss.   This patient has experienced inadequate response or has a contraindication to intra articular steroid injections for at least 3 months.   This patient is not scheduled to have a total knee replacement within 6 months of starting treatment with viscosupplementation.  Follow-Up Instructions: No follow-ups on file.   Orders:  Orders Placed This Encounter  Procedures   Large Joint Inj: L knee   XR KNEE 3 VIEW LEFT   No orders of the defined types were placed in this encounter.     Procedures: Large Joint Inj: L knee on 08/25/2022 10:56 AM Details: 22 G needle Medications: 2 mL bupivacaine 0.5 %; 2 mL lidocaine 1 %; 40 mg methylPREDNISolone acetate 40 MG/ML Outcome: tolerated well, no immediate complications Patient was prepped and draped in the usual sterile fashion.       Clinical Data: No additional findings.   Subjective: Chief Complaint  Patient  presents with   Left Knee - Pain   Right Knee - Pain    HPI  Patient is a very pleasant 67 year old female here for bilateral knee pain worse on the left knee with constant pain.  Currently uses Voltaren gel, ibuprofen, Tylenol for the pain which provides temporary relief.  She denies any injuries.  She is interested in steroid injections.  Describes achy deep-seated knee pain that is worse with activity.  Review of Systems  Constitutional: Negative.   HENT: Negative.    Eyes: Negative.   Respiratory: Negative.    Cardiovascular: Negative.   Endocrine: Negative.   Musculoskeletal: Negative.   Neurological: Negative.   Hematological: Negative.   Psychiatric/Behavioral: Negative.    All other systems reviewed and are negative.    Objective: Vital Signs: Ht  (1.6 m)   Wt 212 lb (96.2 kg)   BMI 37.55 kg/m   Physical Exam Vitals and nursing note reviewed.  Constitutional:      Appearance: She is well-developed.  HENT:     Head: Atraumatic.     Nose: Nose normal.  Eyes:     Extraocular Movements: Extraocular movements intact.  Cardiovascular:     Pulses: Normal pulses.  Pulmonary:     Effort: Pulmonary effort is normal.  Abdominal:     Palpations: Abdomen is soft.  Musculoskeletal:     Cervical back: Neck supple.  Skin:  General: Skin is warm.     Capillary Refill: Capillary refill takes less than 2 seconds.  Neurological:     Mental Status: She is alert. Mental status is at baseline.  Psychiatric:        Behavior: Behavior normal.        Thought Content: Thought content normal.        Judgment: Judgment normal.     Ortho Exam  Examination bilateral knees show global and diffuse pain.  Mild crepitus with range of motion.  Collaterals and cruciates are stable.  Slight joint line tenderness.  Specialty Comments:  No specialty comments available.  Imaging: XR KNEE 3 VIEW LEFT  Result Date: 08/25/2022 X-rays demonstrate moderate tricompartment  osteoarthritis.    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   Hyperlipidemia 01/26/2011   Preventative health care 01/26/2011   Low back pain 01/08/2011   Hypertension 11/25/2010   Scoliosis 11/25/2010   Osteoarthritis 11/25/2010   Past Medical History:  Diagnosis Date   Arthritis    Chest pain    Health maintenance examination    Healthcare maintenance    Hyperlipidemia    Hypertension    Low back pain    Obesity    Osteoarthritis    Other screening mammogram    Scoliosis    Tendonitis, Achilles, right    Wrist pain     Family History  Problem Relation Age of Onset   Diabetes Mother    Hypertension Mother    Cancer Cousin     Past Surgical History:  Procedure Laterality Date   BREAST LUMPECTOMY     TUBAL LIGATION     Social History   Occupational History   Not on file  Tobacco Use   Smoking status: Former    Packs/day: 0.50    Years: 2.00    Additional pack years: 0.00    Total pack years: 1.00    Types: Cigarettes    Quit date: 11/24/1988    Years since quitting: 33.7   Smokeless tobacco: Never  Substance and Sexual Activity   Alcohol use: No   Drug use: No   Sexual activity: Not on file

## 2022-09-07 NOTE — Progress Notes (Unsigned)
[facility]: Clinic Note  Subjective:  History of Present Illness: Tiffany Pacheco is a 67 y.o. year old female who presents for routine follow up of her chronic medical conditions. I am her new PCP.  Her one concern today is chronic low back pain. She has pain on the right side of her low back, worse with exertion, better with rest. Pain does not radiate. No associated numbness, tingling, or weakness in her extremities. Uses tylenol with some relief. Uses aleve sparingly with better relief.   We also discussed lots of healthcare maintenance.    Please refer to Assessment and Plan below for full details in Problem-Based Charting.   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   Hyperlipidemia 01/26/2011   Preventative health care 01/26/2011   Low back pain 01/08/2011   Hypertension 11/25/2010   Scoliosis 11/25/2010   Osteoarthritis 11/25/2010     Medications:  Current Outpatient Medications:    acetaminophen (TYLENOL) 325 MG tablet, Take 1-2 tablets (325-650 mg total) by mouth every 8 (eight) hours as needed., Disp: 60 tablet, Rfl: 2   amLODipine (NORVASC) 5 MG tablet, Take 1 tablet (5 mg total) by mouth daily., Disp: 90 tablet, Rfl: 1   diclofenac Sodium (VOLTAREN) 1 % GEL, APPLY 2 GRAMS TOPICALLY 4 (FOUR) TIMES DAILY., Disp: 400 g, Rfl: 0   fluticasone (FLONASE) 50 MCG/ACT nasal spray, PLACE 1 SPRAY INTO BOTH NOSTRILS DAILY., Disp: 16 g, Rfl: 5   lisinopril (ZESTRIL) 20 MG tablet, TAKE 1 TABLET (20 MG TOTAL) BY MOUTH DAILY., Disp: 90 tablet, Rfl: 1   loratadine (CLARITIN) 10 MG tablet, TAKE 1  TABLET (10 MG TOTAL) BY MOUTH DAILY. FOR ALLERGIES, Disp: 90 tablet, Rfl: 3   methocarbamol (ROBAXIN-750) 750 MG tablet, Take 1 tablet (750 mg total) by mouth every 8 (eight) hours as needed for muscle spasms., Disp: 30 tablet, Rfl: 2   oxybutynin (DITROPAN) 5 MG tablet, Take 1 tablet (5 mg total) by mouth every 8 (eight) hours as needed for bladder spasms., Disp: 90 tablet, Rfl: 3   pantoprazole (PROTONIX) 20 MG tablet, TAKE 1 TABLET (20 MG TOTAL) BY MOUTH DAILY., Disp: 90 tablet, Rfl: 1   Allergies: Allergies  Allergen Reactions   Codeine     Hypotension and passed out.   Fish-Derived Products Hives and Swelling    Pt at [facility]ounder; experienced hives and swelling under bilateral eyes, sore throat with raspy voice.       Review of Systems: ROS   Objective:   Vitals: Vitals:   09/08/22 1033  BP: 117/80  Pulse: 84  Temp: 98.5 F (36.9 C)  SpO2: 97%    Physical Exam: Physical Exam Constitutional:      Appearance: Normal appearance.  Musculoskeletal:     Comments: No ttp over C/T/L spine. Mild ttp over R paraspinal muscles  Neurological:     Mental Status: She is alert.      Data: Labs, imaging, and micro were reviewed in Epic. Refer to Assessment and Plan below for full details in Problem-Based Charting.  Assessment & Plan:  Hypertension - BP is at goal - Continue Amlodipine 5mg  daily & Lisinopril 20mg   daily  - BMP  Scoliosis - I think patient's current back pain is MSK pain from paraspinal muscles. Will try Robaxin  Hyperlipidemia - Check lipid panel today to recalculate ASCVD risk and restart statin if appropriate  Healthcare maintenance - Very behind on healthcare maintenance, so this was a big focus of today's visit - Referral for MMG & DEXA scan placed today - Stool cards given to patient today - Lipid panel today - At next visit, will do PAP & Pneumovax - Need to continue counseling on getting Tdap (medicare) and Shingrix at pharmacy    Patient will  follow up in 2 months for Pap & follow up of HCM.  Mercie Eon, MD

## 2022-09-08 ENCOUNTER — Ambulatory Visit (INDEPENDENT_AMBULATORY_CARE_PROVIDER_SITE_OTHER): Payer: 59 | Admitting: Internal Medicine

## 2022-09-08 ENCOUNTER — Ambulatory Visit (INDEPENDENT_AMBULATORY_CARE_PROVIDER_SITE_OTHER): Payer: 59

## 2022-09-08 ENCOUNTER — Encounter: Payer: Self-pay | Admitting: Internal Medicine

## 2022-09-08 VITALS — BP 117/80 | HR 84 | Temp 98.5°F | Ht 63.0 in | Wt 211.7 lb

## 2022-09-08 DIAGNOSIS — Z0000 Encounter for general adult medical examination without abnormal findings: Secondary | ICD-10-CM | POA: Diagnosis not present

## 2022-09-08 DIAGNOSIS — Z13220 Encounter for screening for lipoid disorders: Secondary | ICD-10-CM

## 2022-09-08 DIAGNOSIS — Z1231 Encounter for screening mammogram for malignant neoplasm of breast: Secondary | ICD-10-CM

## 2022-09-08 DIAGNOSIS — M4126 Other idiopathic scoliosis, lumbar region: Secondary | ICD-10-CM

## 2022-09-08 DIAGNOSIS — E785 Hyperlipidemia, unspecified: Secondary | ICD-10-CM | POA: Diagnosis not present

## 2022-09-08 DIAGNOSIS — I10 Essential (primary) hypertension: Secondary | ICD-10-CM

## 2022-09-08 DIAGNOSIS — M170 Bilateral primary osteoarthritis of knee: Secondary | ICD-10-CM

## 2022-09-08 DIAGNOSIS — Z1211 Encounter for screening for malignant neoplasm of colon: Secondary | ICD-10-CM

## 2022-09-08 DIAGNOSIS — Z13820 Encounter for screening for osteoporosis: Secondary | ICD-10-CM

## 2022-09-08 MED ORDER — METHOCARBAMOL 750 MG PO TABS: 750.0000 mg | ORAL_TABLET | Freq: Three times a day (TID) | ORAL | 2 refills | Status: AC | PRN

## 2022-09-08 MED ORDER — DICLOFENAC SODIUM 1 % EX GEL: CUTANEOUS | 0 refills | Status: DC

## 2022-09-08 NOTE — Assessment & Plan Note (Signed)
-   BP is at goal - Continue Amlodipine 5mg  daily & Lisinopril 20mg  daily  - BMP

## 2022-09-08 NOTE — Assessment & Plan Note (Signed)
-   I think patient's current back pain is MSK pain from paraspinal muscles. Will try Robaxin

## 2022-09-08 NOTE — Progress Notes (Signed)
Subjective:   Tiffany Pacheco is a 67 y.o. female who presents for an Initial Medicare Annual Wellness Visit. I connected with  Masaye Downes Plott on 09/08/22 by a  Face-To-Face encounter  enabled telemedicine application and verified that I am speaking with the correct person using two identifiers.  Patient Location: Other:  Office/Clinic  Provider Location: Office/Clinic  I discussed the limitations of evaluation and management by telemedicine. The patient expressed understanding and agreed to proceed.  Review of Systems    Defer to PCP       Objective:    Today's Vitals   09/08/22 1353 09/08/22 1354  BP: 117/80   Pulse: 84   Temp: 98.5 F (36.9 C)   TempSrc: Oral   SpO2: 97%   Weight: 211 lb 11.2 oz (96 kg)   Height: 5\' 3"  (1.6 m)   PainSc:  0-No pain   Body mass index is 37.5 kg/m.     09/08/2022    1:56 PM 09/08/2022   10:35 AM 09/01/2021   10:11 AM 05/07/2021   10:46 AM 11/12/2020   11:05 AM 07/19/2019    4:07 PM 03/22/2019   11:00 AM  Advanced Directives  Does Patient Have a Medical Advance Directive? No No No No No No No  Would patient like information on creating a medical advance directive? No - Patient declined No - Patient declined No - Patient declined No - Patient declined No - Patient declined No - Patient declined No - Patient declined    Current Medications (verified) Outpatient Encounter Medications as of 09/08/2022  Medication Sig   acetaminophen (TYLENOL) 325 MG tablet Take 1-2 tablets (325-650 mg total) by mouth every 8 (eight) hours as needed.   amLODipine (NORVASC) 5 MG tablet Take 1 tablet (5 mg total) by mouth daily.   diclofenac Sodium (VOLTAREN) 1 % GEL APPLY 2 GRAMS TOPICALLY 4 (FOUR) TIMES DAILY.   fluticasone (FLONASE) 50 MCG/ACT nasal spray PLACE 1 SPRAY INTO BOTH NOSTRILS DAILY.   lisinopril (ZESTRIL) 20 MG tablet TAKE 1 TABLET (20 MG TOTAL) BY MOUTH DAILY.   loratadine (CLARITIN) 10 MG tablet TAKE 1 TABLET (10 MG TOTAL) BY MOUTH DAILY. FOR  ALLERGIES   methocarbamol (ROBAXIN-750) 750 MG tablet Take 1 tablet (750 mg total) by mouth every 8 (eight) hours as needed for muscle spasms.   oxybutynin (DITROPAN) 5 MG tablet Take 1 tablet (5 mg total) by mouth every 8 (eight) hours as needed for bladder spasms.   pantoprazole (PROTONIX) 20 MG tablet TAKE 1 TABLET (20 MG TOTAL) BY MOUTH DAILY.   [DISCONTINUED] famotidine (PEPCID) 20 MG tablet Take 20 mg by mouth daily.     No facility-administered encounter medications on file as of 09/08/2022.    Allergies (verified) Codeine and Fish-derived products   History: Past Medical History:  Diagnosis Date   Arthritis    Chest pain    Health maintenance examination    Healthcare maintenance    Hyperlipidemia    Hypertension    Low back pain    Obesity    Osteoarthritis    Other screening mammogram    Scoliosis    Tendonitis, Achilles, right    Wrist pain    Past Surgical History:  Procedure Laterality Date   BREAST LUMPECTOMY     TUBAL LIGATION     Family History  Problem Relation Age of Onset   Diabetes Mother    Hypertension Mother    Cancer Cousin    Social History  Socioeconomic History   Marital status: Divorced    Spouse name: Not on file   Number of children: Not on file   Years of education: Not on file   Highest education level: Not on file  Occupational History   Not on file  Tobacco Use   Smoking status: Former    Packs/day: 0.50    Years: 2.00    Additional pack years: 0.00    Total pack years: 1.00    Types: Cigarettes    Quit date: 11/24/1988    Years since quitting: 33.8   Smokeless tobacco: Never  Substance and Sexual Activity   Alcohol use: No   Drug use: No   Sexual activity: Not on file  Other Topics Concern   Not on file  Social History Narrative   Not on file   Social Determinants of Health   Financial Resource Strain: Low Risk  (09/08/2022)   Overall Financial Resource Strain (CARDIA)    Difficulty of Paying Living Expenses: Not  very hard  Food Insecurity: No Food Insecurity (09/08/2022)   Hunger Vital Sign    Worried About Running Out of Food in the Last Year: Never true    Ran Out of Food in the Last Year: Never true  Transportation Needs: No Transportation Needs (09/08/2022)   PRAPARE - Administrator, Civil Service (Medical): No    Lack of Transportation (Non-Medical): No  Physical Activity: Not on file  Stress: Not on file  Social Connections: Moderately Integrated (09/08/2022)   Social Connection and Isolation Panel [NHANES]    Frequency of Communication with Friends and Family: More than three times a week    Frequency of Social Gatherings with Friends and Family: More than three times a week    Attends Religious Services: More than 4 times per year    Active Member of [employer] or Organizations: No    Attends Engineer, structural: 1 to 4 times per year    Marital Status: Divorced    Tobacco Counseling Counseling given: Not Answered   Clinical Intake:  Pre-visit preparation completed: Yes  Pain : No/denies pain Pain Score: 0-No pain     Diabetes:  (prediabetes)  How often do you need to have someone help you when you read instructions, pamphlets, or other written materials from your doctor or pharmacy?: 1 - Never What is the last grade level you completed in school?: 11th grade  Diabetic?Nutrition Risk Assessment:  Has the patient had any N/V/D within the last 2 months?  No  Does the patient have any non-healing wounds?  No  Has the patient had any unintentional weight loss or weight gain?  No   Diabetes:  Is the patient diabetic?  Yes  If diabetic, was a CBG obtained today?  No  Did the patient bring in their glucometer from home?  No    Financial Strains and Diabetes Management:  Are you having any financial strains with the device, your supplies or your medication? No .  Does the patient want to be seen by Chronic Care Management for management of their diabetes?  No   Would the patient like to be referred to a Nutritionist or for Diabetic Management?  No   Diabetic Exams:  Diabetic Eye Exam: Overdue for diabetic eye exam. Pt has been advised about the importance in completing this exam. Patient advised to call and schedule an eye exam.   Interpreter Needed?: No  Information entered by :: Casper Harrison  Activities of Daily Living    09/08/2022    1:56 PM 09/08/2022   10:34 AM  In your present state of health, do you have any difficulty performing the following activities:  Hearing? 0 0  Vision? 0 0  Difficulty concentrating or making decisions? 0 0  Walking or climbing stairs? 0 0  Dressing or bathing? 0 0  Doing errands, shopping? 0 0    Patient Care Team: Mercie Eon, MD as PCP - General (Internal Medicine)  Indicate any recent Medical Services you may have received from other than [facility] providers in the past year (date may be approximate).     Assessment:   This is a routine wellness examination for Jeniffer.  Hearing/Vision screen No results found.  Dietary issues and exercise activities discussed:     Goals Addressed   None   Depression Screen    09/08/2022    1:56 PM 09/08/2022   10:43 AM 09/08/2022   10:34 AM 09/01/2021   10:12 AM 05/07/2021   10:47 AM 11/12/2020    9:46 AM 03/22/2019   10:59 AM  PHQ 2/9 Scores  PHQ - 2 Score 0 0 0 0 0 0 0  PHQ- 9 Score     1      Fall Risk    09/08/2022    1:56 PM 09/08/2022   10:34 AM 05/07/2021   10:46 AM 11/12/2020    9:45 AM 07/19/2019    4:06 PM  Fall Risk   Falls in the past year? 0 0 0 0   Number falls in past yr: 0 0  0   Injury with Fall? 0 1     Risk for fall due to : No Fall Risks      Follow up Falls evaluation completed;Falls prevention discussed Falls evaluation completed Falls evaluation completed  Falls prevention discussed    FALL RISK PREVENTION PERTAINING TO THE HOME:  Any stairs in or around the home? No  If so, are there any without handrails? No  Home free of  loose throw rugs in walkways, pet beds, electrical cords, etc? Yes  Adequate lighting in your home to reduce risk of falls? Yes   ASSISTIVE DEVICES UTILIZED TO PREVENT FALLS:  Life alert? No  Use of a cane, walker or w/c? Yes  Grab bars in the bathroom? No  Shower chair or bench in shower? No  Elevated toilet seat or a handicapped toilet? No   TIMED UP AND GO:  Was the test performed? No .  Length of time to ambulate 10 feet: 0 sec.   Gait slow and steady with assistive device  Cognitive Function:        Immunizations Immunization History  Administered Date(s) Administered   Influenza Split 03/20/2012   Influenza,inj,Quad PF,6+ Mos 04/04/2013, 02/13/2014   Tdap 11/25/2010    TDAP status: Due, Education has been provided regarding the importance of this vaccine. Advised may receive this vaccine at local pharmacy or Health Dept. Aware to provide a copy of the vaccination record if obtained from local pharmacy or Health Dept. Verbalized acceptance and understanding.  Flu Vaccine status: Up to date  Pneumococcal vaccine status: Due, Education has been provided regarding the importance of this vaccine. Advised may receive this vaccine at local pharmacy or Health Dept. Aware to provide a copy of the vaccination record if obtained from local pharmacy or Health Dept. Verbalized acceptance and understanding.  Covid-19 vaccine status: Information provided on how to obtain vaccines.   Qualifies  for Shingles Vaccine? No   Zostavax completed No   Shingrix Completed?: No.    Education has been provided regarding the importance of this vaccine. Patient has been advised to call insurance company to determine out of pocket expense if they have not yet received this vaccine. Advised may also receive vaccine at local pharmacy or Health Dept. Verbalized acceptance and understanding.  Screening Tests Health Maintenance  Topic Date Due   COVID-19 Vaccine (1) Never done   COLONOSCOPY (Pts  45-34yrs Insurance coverage will need to be confirmed)  Never done   Zoster Vaccines- Shingrix (1 of 2) Never done   COLON CANCER SCREENING ANNUAL FOBT  03/21/2015   MAMMOGRAM  03/05/2016   PAP SMEAR-Modifier  02/13/2017   DTaP/Tdap/Td (2 - Td or Tdap) 11/24/2020   Pneumonia Vaccine 66+ Years old (1 of 1 - PCV) Never done   DEXA SCAN  Never done   INFLUENZA VACCINE  12/02/2022   Medicare Annual Wellness (AWV)  09/08/2023   HIV Screening  Completed   Hepatitis C Screening  Addressed   HPV VACCINES  Aged Out    Health Maintenance  Health Maintenance Due  Topic Date Due   COVID-19 Vaccine (1) Never done   COLONOSCOPY (Pts 45-15yrs Insurance coverage will need to be confirmed)  Never done   Zoster Vaccines- Shingrix (1 of 2) Never done   COLON CANCER SCREENING ANNUAL FOBT  03/21/2015   MAMMOGRAM  03/05/2016   PAP SMEAR-Modifier  02/13/2017   DTaP/Tdap/Td (2 - Td or Tdap) 11/24/2020   Pneumonia Vaccine 58+ Years old (1 of 1 - PCV) Never done   DEXA SCAN  Never done     Mammogram status: Ordered 09/08/2022. Pt provided with contact info and advised to call to schedule appt.   Bone Density status: Ordered 09/08/2022. Pt provided with contact info and advised to call to schedule appt.  Lung Cancer Screening: (Low Dose CT Chest recommended if Age 47-80 years, 30 pack-year currently smoking OR have quit w/in 15years.) does not qualify.   Lung Cancer Screening Referral: N/A  Additional Screening:  Hepatitis C Screening: does not qualify; Completed 10/06/2015  Vision Screening: Recommended annual ophthalmology exams for early detection of glaucoma and other disorders of the eye. Is the patient up to date with their annual eye exam?  No  Who is the provider or what is the name of the office in which the patient attends annual eye exams? N/A If pt is not established with a provider, would they like to be referred to a provider to establish care? No .   Dental Screening: Recommended  annual dental exams for proper oral hygiene  Community Resource Referral / Chronic Care Management: CRR required this visit?  No   CCM required this visit?  No      Plan:     I have personally reviewed and noted the following in the patient's chart:   Medical and social history Use of alcohol, tobacco or illicit drugs  Current medications and supplements including opioid prescriptions. Patient is not currently taking opioid prescriptions. Functional ability and status Nutritional status Physical activity Advanced directives List of other physicians Hospitalizations, surgeries, and ER visits in previous 12 months Vitals Screenings to include cognitive, depression, and falls Referrals and appointments  In addition, I have reviewed and discussed with patient certain preventive protocols, quality metrics, and best practice recommendations. A written personalized care plan for preventive services as well as general preventive health recommendations were provided to patient.  Cala Bradford, CMA   09/08/2022   Nurse Notes: Face-To-Face Visit  Ms. Quillen , Thank you for taking time to come for your Medicare Wellness Visit. I appreciate your ongoing commitment to your health goals. Please review the following plan we discussed and let me know if I can assist you in the future.   These are the goals we discussed:  Goals      Blood Pressure < 140/90        This is a list of the screening recommended for you and due dates:  Health Maintenance  Topic Date Due   COVID-19 Vaccine (1) Never done   Colon Cancer Screening  Never done   Zoster (Shingles) Vaccine (1 of 2) Never done   Stool Blood Test  03/21/2015   Mammogram  03/05/2016   Pap Smear  02/13/2017   DTaP/Tdap/Td vaccine (2 - Td or Tdap) 11/24/2020   Pneumonia Vaccine (1 of 1 - PCV) Never done   DEXA scan (bone density measurement)  Never done   Flu Shot  12/02/2022   Medicare Annual Wellness Visit  09/08/2023   HIV  Screening  Completed   Hepatitis C Screening: USPSTF Recommendation to screen - Ages 18-79 yo.  Addressed   HPV Vaccine  Aged Out

## 2022-09-08 NOTE — Assessment & Plan Note (Signed)
-   Check lipid panel today to recalculate ASCVD risk and restart statin if appropriate

## 2022-09-08 NOTE — Assessment & Plan Note (Signed)
-   Very behind on healthcare maintenance, so this was a big focus of today's visit - Referral for MMG & DEXA scan placed today - Stool cards given to patient today - Lipid panel today - At next visit, will do PAP & Pneumovax - Need to continue counseling on getting Tdap (medicare) and Shingrix at pharmacy

## 2022-09-08 NOTE — Patient Instructions (Signed)
Dear Ms Kramm,  It was a pleasure seeing you in clinic today.  For your back pain, I agree that you are having muscle spasms. I have started a muscle relaxant for you to try. First, take this at night. If it makes you sleepy, please let me know, and I'll order you a lower dose. You may take this medicines up to three times daily as needed for back spasms.   We also talked about your health screenings. I have ordered a Mammogram and bone scan for you to get. I will also give you stool cards to screen for colon cancer. I'll call you with the results of today's labs, and will let you know if you need to be on a medicine for your cholesterol.  I'll see you back in 2 months. We'll do your vaccines and pap smear at that visit.   Sincerely, Dr. Mercie Eon

## 2022-09-09 NOTE — Progress Notes (Signed)
I reviewed the AWV findings with the provider who conducted the visit. I was present in the office suite and immediately available to provide assistance and direction throughout the time the service was provided.  

## 2022-09-10 LAB — LIPID PANEL
Chol/HDL Ratio: 5.3 ratio — ABNORMAL HIGH (ref 0.0–4.4)
Cholesterol, Total: 248 mg/dL — ABNORMAL HIGH (ref 100–199)
HDL: 47 mg/dL (ref >39)
LDL Chol Calc (NIH): 186 mg/dL — ABNORMAL HIGH (ref 0–99)
Triglycerides: 87 mg/dL (ref 0–149)
VLDL Cholesterol Cal: 15 mg/dL (ref 5–40)

## 2022-09-10 LAB — BMP8+ANION GAP
Anion Gap: 15 mmol/L (ref 10.0–18.0)
BUN/Creatinine Ratio: 13 (ref 12–28)
BUN: 10 mg/dL (ref 8–27)
CO2: 22 mmol/L (ref 20–29)
Calcium: 9.1 mg/dL (ref 8.7–10.3)
Chloride: 105 mmol/L (ref 96–106)
Creatinine, Ser: 0.78 mg/dL (ref 0.57–1.00)
Glucose: 106 mg/dL — ABNORMAL HIGH (ref 70–99)
Potassium: 4.3 mmol/L (ref 3.5–5.2)
Sodium: 142 mmol/L (ref 134–144)
eGFR: 84 mL/min/{1.73_m2} (ref >59)

## 2022-09-13 ENCOUNTER — Other Ambulatory Visit: Payer: Self-pay | Admitting: Internal Medicine

## 2022-09-13 DIAGNOSIS — E782 Mixed hyperlipidemia: Secondary | ICD-10-CM

## 2022-09-13 MED ORDER — ROSUVASTATIN CALCIUM 5 MG PO TABS
5.0000 mg | ORAL_TABLET | Freq: Every day | ORAL | 3 refills | Status: DC
Start: 2022-09-13 — End: 2023-02-08

## 2022-10-11 ENCOUNTER — Other Ambulatory Visit: Payer: Self-pay | Admitting: Student in an Organized Health Care Education/Training Program

## 2022-10-11 ENCOUNTER — Ambulatory Visit: Payer: 59

## 2022-10-11 DIAGNOSIS — M170 Bilateral primary osteoarthritis of knee: Secondary | ICD-10-CM

## 2022-10-13 ENCOUNTER — Ambulatory Visit (INDEPENDENT_AMBULATORY_CARE_PROVIDER_SITE_OTHER): Payer: 59 | Admitting: Student

## 2022-10-13 DIAGNOSIS — N3941 Urge incontinence: Secondary | ICD-10-CM

## 2022-10-14 NOTE — Progress Notes (Signed)
   CC: Incontinence supplies  This is a telephone encounter between [employer] and Tiffany Pacheco on 10/14/2022 for it got it supplies. The visit was conducted with the patient located at home and [place] Tiffany Pacheco at [facility]. The patient's identity was confirmed using their DOB and current address. The patient has consented to being evaluated through a telephone encounter and understands the associated risks (an examination cannot be done and the patient may need to come in for an appointment) / benefits (allows the patient to remain at home, decreasing exposure to coronavirus). I personally spent 10 minutes on medical discussion.   HPI:  [name] is a 67 y.o. with PMH as below.   Please see A&P for assessment of the patient's acute and chronic medical conditions.   Past Medical History:  Diagnosis Date   Arthritis    Chest pain    Health maintenance examination    Healthcare maintenance    Hyperlipidemia    Hypertension    Low back pain    Obesity    Osteoarthritis    Other screening mammogram    Scoliosis    Tendonitis, Achilles, right    Wrist pain    Review of Systems: Negative except stated in the assessment and plan    Assessment & Plan:   Urge incontinence Patient presents via telehealth for resupply of her incontinence supplies.  She endorses that anytime she coughs or stands up some urine drips out.  She also states that it is difficult for her sometimes to make it to the restroom when she does have to urinate.  She has normal sensation and does feel the need to go.  I believe she would greatly benefit from these supplies, and have signed the DME order for them.   Patient discussed with Dr. Baird Lyons Justen Fonda Internal Medicine Resident

## 2022-10-14 NOTE — Assessment & Plan Note (Addendum)
Patient presents via telehealth for resupply of her incontinence supplies.  She endorses that anytime she coughs or stands up some urine drips out.  She also states that it is difficult for her sometimes to make it to the restroom when she does have to urinate.  She has normal sensation and does feel the need to go.  I believe she would greatly benefit from these supplies, and have signed the DME order for them.

## 2022-10-20 ENCOUNTER — Ambulatory Visit: Payer: 59

## 2022-10-28 NOTE — Progress Notes (Signed)
Internal Medicine Clinic Attending  Case discussed with Dr. Nooruddin  At the time of the visit.  We reviewed the resident's history and exam and pertinent patient test results.  I agree with the assessment, diagnosis, and plan of care documented in the resident's note.  

## 2022-11-03 ENCOUNTER — Telehealth: Payer: Self-pay | Admitting: Orthopaedic Surgery

## 2022-11-03 NOTE — Telephone Encounter (Signed)
Patient called. Would like gel injections in bil knee.

## 2022-11-08 ENCOUNTER — Other Ambulatory Visit: Payer: Self-pay | Admitting: Internal Medicine

## 2022-11-08 DIAGNOSIS — J309 Allergic rhinitis, unspecified: Secondary | ICD-10-CM

## 2022-11-08 NOTE — Telephone Encounter (Signed)
VOB submitted Durolane, bilateral knee

## 2022-11-10 ENCOUNTER — Ambulatory Visit: Payer: 59

## 2022-11-18 ENCOUNTER — Other Ambulatory Visit: Payer: Self-pay

## 2022-11-18 DIAGNOSIS — M170 Bilateral primary osteoarthritis of knee: Secondary | ICD-10-CM

## 2022-11-19 MED ORDER — DICLOFENAC SODIUM 1 % EX GEL
CUTANEOUS | 0 refills | Status: DC
Start: 2022-11-19 — End: 2023-01-05

## 2022-11-25 ENCOUNTER — Other Ambulatory Visit: Payer: Self-pay

## 2022-11-25 DIAGNOSIS — M1712 Unilateral primary osteoarthritis, left knee: Secondary | ICD-10-CM

## 2022-11-25 DIAGNOSIS — M1711 Unilateral primary osteoarthritis, right knee: Secondary | ICD-10-CM

## 2022-12-01 ENCOUNTER — Ambulatory Visit: Payer: 59

## 2022-12-08 ENCOUNTER — Ambulatory Visit (INDEPENDENT_AMBULATORY_CARE_PROVIDER_SITE_OTHER): Payer: 59 | Admitting: Physician Assistant

## 2022-12-08 ENCOUNTER — Ambulatory Visit: Payer: 59

## 2022-12-08 DIAGNOSIS — M1712 Unilateral primary osteoarthritis, left knee: Secondary | ICD-10-CM | POA: Diagnosis not present

## 2022-12-08 DIAGNOSIS — M170 Bilateral primary osteoarthritis of knee: Secondary | ICD-10-CM

## 2022-12-08 DIAGNOSIS — M1711 Unilateral primary osteoarthritis, right knee: Secondary | ICD-10-CM | POA: Diagnosis not present

## 2022-12-08 MED ORDER — LIDOCAINE HCL 1 % IJ SOLN
3.0000 mL | INTRAMUSCULAR | Status: AC | PRN
Start: 2022-12-08 — End: 2022-12-08
  Administered 2022-12-08: 3 mL

## 2022-12-08 MED ORDER — SODIUM HYALURONATE 60 MG/3ML IX PRSY
60.0000 mg | PREFILLED_SYRINGE | INTRA_ARTICULAR | Status: AC | PRN
Start: 2022-12-08 — End: 2022-12-08
  Administered 2022-12-08: 60 mg via INTRA_ARTICULAR

## 2022-12-08 MED ORDER — BUPIVACAINE HCL 0.25 % IJ SOLN
0.6600 mL | INTRAMUSCULAR | Status: AC | PRN
Start: 2022-12-08 — End: 2022-12-08
  Administered 2022-12-08: .66 mL via INTRA_ARTICULAR

## 2022-12-08 NOTE — Progress Notes (Signed)
Office Visit Note   Patient: Tiffany Pacheco           Date of Birth: [birth year]           MRN: [id number] Visit Date: 12/08/2022              Requested by: Mercie Eon, MD [address], [address] [place],  [place] [address] PCP: Mercie Eon, MD   Assessment & Plan: Visit Diagnoses:  1. Bilateral primary osteoarthritis of knee     Plan:) Patient has bilateral knee osteoarthritis.  Today, proceed with bilateral knee Durolane injections.  She tolerated this well.  She will follow-up with [place] as needed. In expiration date 06/02/2025 Lot #[id number]  Follow-Up Instructions: No follow-ups on file.   Orders:  Orders Placed This Encounter  Procedures   Large Joint Inj   No orders of the defined types were placed in this encounter.     Procedures: Large Joint Inj: bilateral knee on 12/08/2022 8:38 AM Indications: pain Details: 22 G needle, anterolateral approach Medications (Right): 0.66 mL bupivacaine 0.25 %; 3 mL lidocaine 1 %; 60 mg Sodium Hyaluronate 60 MG/3ML Medications (Left): 0.66 mL bupivacaine 0.25 %; 3 mL lidocaine 1 %; 60 mg Sodium Hyaluronate 60 MG/3ML      Clinical Data: No additional findings.   Subjective: Chief Complaint  Patient presents with   Left Knee - Pain   Right Knee - Pain    HPI patient is a pleasant 67 year old female with underlying bilateral knee DJD who comes in today for viscosupplementation injection to both knees.  Previous cortisone injections have not provided long-lasting relief.  No previous viscosupplementation injection performed.     Objective: Vital Signs: There were no vitals taken for this visit.    Ortho Exam unchanged bilateral knee exam  Specialty Comments:  No specialty comments available.  Imaging: No new imaging   PMFS History: Patient Active Problem List   Diagnosis Date Noted   Healthcare maintenance 09/08/2022   Prediabetes 08/13/2022   Encounter for screening mammogram for malignant neoplasm of breast  03/22/2019   Urge incontinence 08/30/2018   Insomnia 12/20/2012   Chronic allergic rhinitis 03/20/2012   GERD (gastroesophageal reflux disease) 07/19/2011   CMC arthritis, thumb, degenerative 03/29/2011   Hyperlipidemia 01/26/2011   Preventative health care 01/26/2011   Low back pain 01/08/2011   Hypertension 11/25/2010   Scoliosis 11/25/2010   Osteoarthritis 11/25/2010   Past Medical History:  Diagnosis Date   Arthritis    Chest pain    Health maintenance examination    Healthcare maintenance    Hyperlipidemia    Hypertension    Low back pain    Obesity    Osteoarthritis    Other screening mammogram    Scoliosis    Tendonitis, Achilles, right    Wrist pain     Family History  Problem Relation Age of Onset   Diabetes Mother    Hypertension Mother    Cancer Cousin     Past Surgical History:  Procedure Laterality Date   BREAST LUMPECTOMY     TUBAL LIGATION     Social History   Occupational History   Not on file  Tobacco Use   Smoking status: Former    Current packs/day: 0.00    Average packs/day: 0.5 packs/day for 2.0 years (1.0 ttl pk-yrs)    Types: Cigarettes    Start date: 11/25/1986    Quit date: 11/24/1988    Years since quitting: 34.0  Smokeless tobacco: Never  Substance and Sexual Activity   Alcohol use: No   Drug use: No   Sexual activity: Not on file

## 2022-12-21 NOTE — Progress Notes (Deleted)
[facility]: Clinic Note  Subjective:  History of Present Illness: Tiffany Pacheco is a 67 y.o. year old female who presents for routine follow up of her chronic medical conditions. I saw her in May.  # HTN- amlo 5, lisin 20  # MSK pain - did robaxin work? PT wanted?  HLD- restart statin The 10-year ASCVD risk score (Arnett DK, et al., 2019) is: 9.3%   Values used to calculate the score:     Age: 59 years     Sex: Female     Is Non-Hispanic African American: Yes     Diabetic: No     Tobacco smoker: No     Systolic Blood Pressure: 117 mmHg     Is BP treated: Yes     HDL Cholesterol: 47 mg/dL     Total Cholesterol: 248 mg/dL  MMG - scheduled for 8/84 DEXA- scheduled for 12/11 Stool cards  [ ]  PAP!! [ ]  pneumovax     Please refer to Assessment and Plan below for full details in Problem-Based Charting.   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   Hyperlipidemia 01/26/2011   Preventative health care 01/26/2011   Low back pain 01/08/2011   Hypertension 11/25/2010   Scoliosis 11/25/2010   Osteoarthritis 11/25/2010      Medications:  Current Outpatient Medications:    rosuvastatin (CRESTOR) 5 MG tablet, Take 1 tablet (5 mg total) by mouth daily., Disp: 90 tablet, Rfl: 3   acetaminophen (TYLENOL) 325 MG tablet, Take 1-2 tablets (325-650 mg total) by mouth every 8 (eight) hours as needed., Disp: 60 tablet, Rfl: 2   amLODipine (NORVASC) 5 MG tablet, Take 1 tablet (5 mg total) by mouth daily., Disp: 90 tablet, Rfl: 1   diclofenac Sodium (VOLTAREN) 1 % GEL, APPLY 2 GRAMS TOPICALLY 4  (FOUR) TIMES DAILY., Disp: 400 g, Rfl: 0   fluticasone (FLONASE) 50 MCG/ACT nasal spray, PLACE 1 SPRAY INTO BOTH NOSTRILS DAILY., Disp: 16 g, Rfl: 5   lisinopril (ZESTRIL) 20 MG tablet, TAKE 1 TABLET (20 MG TOTAL) BY MOUTH DAILY., Disp: 90 tablet, Rfl: 1   loratadine (CLARITIN) 10 MG tablet, TAKE 1 TABLET (10 MG TOTAL) BY MOUTH DAILY. FOR ALLERGIES, Disp: 90 tablet, Rfl: 3   oxybutynin (DITROPAN) 5 MG tablet, Take 1 tablet (5 mg total) by mouth every 8 (eight) hours as needed for bladder spasms., Disp: 90 tablet, Rfl: 3   pantoprazole (PROTONIX) 20 MG tablet, TAKE 1 TABLET (20 MG TOTAL) BY MOUTH DAILY., Disp: 90 tablet, Rfl: 1   Allergies: Allergies  Allergen Reactions   Codeine     Hypotension and passed out.   Fish-Derived Products Hives and Swelling    Pt at flounder; experienced hives and swelling under bilateral eyes, sore throat with raspy voice.          Objective:   Vitals: There were no vitals filed for this visit.   Physical Exam: Physical Exam   Data: Labs, imaging, and micro were reviewed in Epic. Refer to Assessment and Plan below for full details in Problem-Based Charting.  Assessment & Plan:  No problem-specific Assessment & Plan notes found for this  encounter.     Patient will follow up in ***  Mercie Eon, MD

## 2022-12-22 ENCOUNTER — Encounter: Payer: 59 | Admitting: Internal Medicine

## 2023-01-04 ENCOUNTER — Other Ambulatory Visit: Payer: Self-pay | Admitting: Internal Medicine

## 2023-01-04 DIAGNOSIS — M170 Bilateral primary osteoarthritis of knee: Secondary | ICD-10-CM

## 2023-01-12 ENCOUNTER — Ambulatory Visit: Payer: 59

## 2023-01-25 ENCOUNTER — Ambulatory Visit: Payer: 59

## 2023-01-28 ENCOUNTER — Ambulatory Visit: Payer: 59

## 2023-02-01 NOTE — Progress Notes (Unsigned)
[facility]: Clinic Note  Subjective:  History of Present Illness: Tiffany Pacheco is a 67 y.o. year old female who presents for routine 75-month follow up.   She has no concerns today.  She took a Robaxin before the visit, so she's a bit sleepy, and declined pap and vaccines today.  Please refer to Assessment and Plan below for full details in Problem-Based Charting.   Past Medical History:  Patient Active Problem List   Diagnosis Date Noted   Prediabetes 08/13/2022   Encounter for screening mammogram for malignant neoplasm of breast 03/22/2019   Urge incontinence 08/30/2018   Insomnia 12/20/2012   Chronic allergic rhinitis 03/20/2012   GERD (gastroesophageal reflux disease) 07/19/2011   Osteoarthritis of carpometacarpal joint of thumb 03/29/2011   Hyperlipidemia 01/26/2011   Healthcare maintenance 01/26/2011   Low back pain 01/08/2011   Hypertension 11/25/2010   Scoliosis 11/25/2010   Osteoarthritis 11/25/2010      Medications:  Current Outpatient Medications:    rosuvastatin (CRESTOR) 5 MG tablet, Take 1 tablet (5 mg total) by mouth daily., Disp: 90 tablet, Rfl: 3   acetaminophen (TYLENOL) 325 MG tablet, Take 1-2 tablets (325-650 mg total) by mouth every 8 (eight) hours as needed., Disp: 60 tablet, Rfl: 2   amLODipine (NORVASC) 5 MG tablet, Take 1 tablet (5 mg total) by mouth daily., Disp: 90 tablet, Rfl: 1   diclofenac Sodium (VOLTAREN) 1 % GEL, APPLY 2 GRAMS TOPICALLY 4 (FOUR) TIMES DAILY., Disp: 400 g, Rfl: 11   fluticasone (FLONASE) 50 MCG/ACT nasal spray, PLACE 1 SPRAY INTO BOTH NOSTRILS DAILY., Disp: 16 g, Rfl: 5   lisinopril (ZESTRIL) 20 MG tablet, TAKE 1 TABLET (20 MG TOTAL) BY MOUTH DAILY., Disp: 90 tablet, Rfl: 1   loratadine (CLARITIN) 10 MG tablet, TAKE 1 TABLET (10 MG TOTAL) BY MOUTH DAILY. FOR ALLERGIES, Disp: 90 tablet, Rfl: 3   oxybutynin (DITROPAN) 5 MG tablet, Take 1 tablet (5 mg total) by mouth every 8 (eight) hours as needed for  bladder spasms., Disp: 90 tablet, Rfl: 3   pantoprazole (PROTONIX) 20 MG tablet, TAKE 1 TABLET (20 MG TOTAL) BY MOUTH DAILY., Disp: 90 tablet, Rfl: 1   Allergies: Allergies  Allergen Reactions   Codeine     Hypotension and passed out.   Fish-Derived Products Hives and Swelling    Pt at flounder; experienced hives and swelling under bilateral eyes, sore throat with raspy voice.          Objective:   Vitals: Vitals:   02/02/23 1054  BP: 124/87  Pulse: 88  Temp: 98.2 F (36.8 C)  SpO2: 99%     Physical Exam: Physical Exam Constitutional:      Appearance: Normal appearance. She is obese.  Cardiovascular:     Pulses: Normal pulses.     Heart sounds: Normal heart sounds. No murmur heard. Pulmonary:     Effort: Pulmonary effort is normal.     Breath sounds: Normal breath sounds.  Neurological:     Mental Status: She is alert.      The 10-year ASCVD risk score (Arnett DK, et al., 2019) is: 9.3%   Values used to calculate the score:     Age: 6 years     Sex: Female     Is Non-Hispanic African American: Yes     Diabetic: No     Tobacco smoker: No     Systolic Blood Pressure: 117 mmHg     Is BP treated: Yes  HDL Cholesterol: 47 mg/dL     Total Cholesterol: 248 mg/dL    Data: Labs, imaging, and micro were reviewed in Epic. Refer to Assessment and Plan below for full details in Problem-Based Charting.  Assessment & Plan:  Hypertension - BP is at goal - Continue Amlodipine 5mg  daily & Lisinopril 20mg  daily  - BMP  Osteoarthritis - Patient has chronic osteoarthritis of her bilateral knees, she sees Orthopedic surgery for this - Continue voltaren gel as needed - She will follow up with Ortho as needed for repeat steroid injections  Hyperlipidemia - we started rosuvastatin 5mg  daily last visit - repeat lipid panel today  Healthcare maintenance - Patient declined pap, pneumovax, and flu shot today - MMG & DEXA scheduled - She has stool cards, I encouraged  her to bring next time - I counseled to get Tdap & Shingrix & covid vaccines at pharmacy        Patient will follow up in 6 months and prn  Mercie Eon, MD

## 2023-02-02 ENCOUNTER — Encounter: Payer: Self-pay | Admitting: Internal Medicine

## 2023-02-02 ENCOUNTER — Ambulatory Visit: Payer: 59 | Admitting: Internal Medicine

## 2023-02-02 VITALS — BP 124/87 | HR 88 | Temp 98.2°F | Ht 63.0 in | Wt 210.6 lb

## 2023-02-02 DIAGNOSIS — Z87891 Personal history of nicotine dependence: Secondary | ICD-10-CM | POA: Diagnosis not present

## 2023-02-02 DIAGNOSIS — I10 Essential (primary) hypertension: Secondary | ICD-10-CM | POA: Diagnosis not present

## 2023-02-02 DIAGNOSIS — M170 Bilateral primary osteoarthritis of knee: Secondary | ICD-10-CM

## 2023-02-02 DIAGNOSIS — E785 Hyperlipidemia, unspecified: Secondary | ICD-10-CM | POA: Diagnosis not present

## 2023-02-02 DIAGNOSIS — Z0000 Encounter for general adult medical examination without abnormal findings: Secondary | ICD-10-CM

## 2023-02-02 MED ORDER — DICLOFENAC SODIUM 1 % EX GEL: CUTANEOUS | 11 refills | Status: AC

## 2023-02-02 NOTE — Assessment & Plan Note (Signed)
-   Patient has chronic osteoarthritis of her bilateral knees, she sees Orthopedic surgery for this - Continue voltaren gel as needed - She will follow up with Ortho as needed for repeat steroid injections

## 2023-02-02 NOTE — Assessment & Plan Note (Signed)
-   we started rosuvastatin 5mg  daily last visit - repeat lipid panel today

## 2023-02-02 NOTE — Patient Instructions (Signed)
Dear Ms Dedominicis,  It was a pleasure seeing you in clinic today.  I did not make any changes to your medicines.  We will do your pap and your vaccines at our next appointment. In the meantime, I encourage you to get your shingles, covid, and tetanus shots at your local pharmacy since we don't carry them in the clinic.  I'll see you back in 6 months.  Sincerely, Dr. Mercie Eon

## 2023-02-02 NOTE — Assessment & Plan Note (Signed)
-   Patient declined pap, pneumovax, and flu shot today - MMG & DEXA scheduled - She has stool cards, I encouraged her to bring next time - I counseled to get Tdap & Shingrix & covid vaccines at pharmacy

## 2023-02-02 NOTE — Assessment & Plan Note (Signed)
-   BP is at goal - Continue Amlodipine 5mg  daily & Lisinopril 20mg  daily  - BMP

## 2023-02-04 LAB — BMP8+ANION GAP
Anion Gap: 15 mmol/L (ref 10.0–18.0)
BUN/Creatinine Ratio: 13 (ref 12–28)
BUN: 11 mg/dL (ref 8–27)
CO2: 24 mmol/L (ref 20–29)
Calcium: 9.3 mg/dL (ref 8.7–10.3)
Chloride: 103 mmol/L (ref 96–106)
Creatinine, Ser: 0.85 mg/dL (ref 0.57–1.00)
Glucose: 108 mg/dL — ABNORMAL HIGH (ref 70–99)
Potassium: 4.3 mmol/L (ref 3.5–5.2)
Sodium: 142 mmol/L (ref 134–144)
eGFR: 76 mL/min/{1.73_m2} (ref >59)

## 2023-02-04 LAB — LIPID PANEL
Chol/HDL Ratio: 3.9 {ratio} (ref 0.0–4.4)
Cholesterol, Total: 181 mg/dL (ref 100–199)
HDL: 47 mg/dL (ref >39)
LDL Chol Calc (NIH): 113 mg/dL — ABNORMAL HIGH (ref 0–99)
Triglycerides: 115 mg/dL (ref 0–149)
VLDL Cholesterol Cal: 21 mg/dL (ref 5–40)

## 2023-02-08 ENCOUNTER — Other Ambulatory Visit: Payer: Self-pay | Admitting: Internal Medicine

## 2023-02-08 MED ORDER — ROSUVASTATIN CALCIUM 10 MG PO TABS: 10.0000 mg | ORAL_TABLET | Freq: Every day | ORAL | 3 refills | Status: AC

## 2023-02-08 MED ORDER — ROSUVASTATIN CALCIUM 10 MG PO TABS: 10.0000 mg | ORAL_TABLET | Freq: Every day | ORAL | 11 refills | Status: DC

## 2023-03-01 ENCOUNTER — Ambulatory Visit: Payer: 59

## 2023-03-04 ENCOUNTER — Telehealth: Payer: Self-pay

## 2023-03-04 NOTE — Telephone Encounter (Signed)
Pt states diclofenac Sodium (VOLTAREN) 1 % GEL will no longer covered by the insurance because it's over the counter. Pt states she cannot afford anything over the counter. Requesting another pain cream that will be covered through her insurance. Please call pt back.

## 2023-03-08 ENCOUNTER — Other Ambulatory Visit: Payer: Self-pay | Admitting: Internal Medicine

## 2023-03-08 ENCOUNTER — Other Ambulatory Visit (HOSPITAL_COMMUNITY): Payer: Self-pay

## 2023-03-08 MED ORDER — LIDOCAINE 5 % EX OINT: 1.0000 | TOPICAL_OINTMENT | CUTANEOUS | 3 refills | Status: AC | PRN

## 2023-03-08 NOTE — Telephone Encounter (Signed)
RTC to patient informed her that Dr, Lafonda Mosses and the Pharmacy team have found that her insurance will pay for Lidocaine Gel and that a prescription for has been sent to her pharmacy.  Patient was very thankful to hear.

## 2023-03-25 ENCOUNTER — Ambulatory Visit: Payer: 59

## 2023-04-11 ENCOUNTER — Other Ambulatory Visit: Payer: Self-pay

## 2023-04-11 DIAGNOSIS — K219 Gastro-esophageal reflux disease without esophagitis: Secondary | ICD-10-CM

## 2023-04-11 DIAGNOSIS — I10 Essential (primary) hypertension: Secondary | ICD-10-CM

## 2023-04-11 MED ORDER — LISINOPRIL 20 MG PO TABS
20.0000 mg | ORAL_TABLET | Freq: Every day | ORAL | 1 refills | Status: AC
Start: 2023-04-11 — End: ?

## 2023-04-11 MED ORDER — PANTOPRAZOLE SODIUM 20 MG PO TBEC: 20.0000 mg | DELAYED_RELEASE_TABLET | Freq: Every day | ORAL | 1 refills | Status: DC

## 2023-04-11 MED ORDER — AMLODIPINE BESYLATE 5 MG PO TABS: 5.0000 mg | ORAL_TABLET | Freq: Every day | ORAL | 1 refills | Status: DC

## 2023-04-13 ENCOUNTER — Inpatient Hospital Stay: Admission: RE | Admit: 2023-04-13 | Payer: 59 | Source: Ambulatory Visit

## 2023-06-08 ENCOUNTER — Other Ambulatory Visit: Payer: Self-pay | Admitting: Infectious Diseases

## 2023-06-08 DIAGNOSIS — J309 Allergic rhinitis, unspecified: Secondary | ICD-10-CM

## 2023-06-08 NOTE — Telephone Encounter (Signed)
 Medication sent to pharmacy

## 2023-06-10 ENCOUNTER — Other Ambulatory Visit: Payer: Self-pay | Admitting: Nurse Practitioner

## 2023-06-10 DIAGNOSIS — Z1231 Encounter for screening mammogram for malignant neoplasm of breast: Secondary | ICD-10-CM

## 2023-07-07 ENCOUNTER — Other Ambulatory Visit: Payer: Self-pay

## 2023-07-07 DIAGNOSIS — I10 Essential (primary) hypertension: Secondary | ICD-10-CM

## 2023-07-07 MED ORDER — AMLODIPINE BESYLATE 5 MG PO TABS: 5.0000 mg | ORAL_TABLET | Freq: Every day | ORAL | 1 refills | Status: AC

## 2023-07-19 ENCOUNTER — Ambulatory Visit: Admitting: Physician Assistant

## 2023-10-28 ENCOUNTER — Ambulatory Visit (INDEPENDENT_AMBULATORY_CARE_PROVIDER_SITE_OTHER): Admitting: Physician Assistant

## 2023-10-28 ENCOUNTER — Other Ambulatory Visit: Payer: Self-pay | Admitting: Physician Assistant

## 2023-10-28 DIAGNOSIS — M170 Bilateral primary osteoarthritis of knee: Secondary | ICD-10-CM

## 2023-10-28 MED ORDER — METHYLPREDNISOLONE ACETATE 40 MG/ML IJ SUSP
13.3300 mg | INTRAMUSCULAR | Status: AC | PRN
  Administered 2023-10-28: 13.33 mg via INTRA_ARTICULAR

## 2023-10-28 MED ORDER — LIDOCAINE HCL 1 % IJ SOLN
3.0000 mL | INTRAMUSCULAR | Status: AC | PRN
  Administered 2023-10-28: 3 mL

## 2023-10-28 MED ORDER — BUPIVACAINE HCL 0.25 % IJ SOLN
0.6600 mL | INTRAMUSCULAR | Status: AC | PRN
  Administered 2023-10-28: .66 mL via INTRA_ARTICULAR

## 2023-10-28 MED ORDER — TRAMADOL HCL 50 MG PO TABS: 50.0000 mg | ORAL_TABLET | Freq: Two times a day (BID) | ORAL | 0 refills | Status: AC | PRN

## 2023-10-28 NOTE — Progress Notes (Signed)
 Office Visit Note   Patient: Tiffany Pacheco           Date of Birth: 1955-10-13           MRN: [id number] Visit Date: 10/28/2023              Requested by: Lovie Clarity, MD [address] [place], [address] [place],  [place] [address] PCP: Shawn Sick, MD   Assessment & Plan: Visit Diagnoses:  1. Bilateral primary osteoarthritis of knee     Plan: Impression is bilateral knee osteoarthritis.  Today, we discussed repeat cortisone injections for which she would like to proceed.  I have also provided her with a total knee replacement handout.  She is aware that she will likely need knee replacement surgery in the future.  Follow-up with us  as needed.  Follow-Up Instructions: Return if symptoms worsen or fail to improve.   Orders:  Orders Placed This Encounter  Procedures   Large Joint Inj: bilateral knee   No orders of the defined types were placed in this encounter.     Procedures: Large Joint Inj: bilateral knee on 10/28/2023 1:31 PM Indications: pain Details: 22 G needle, anterolateral approach Medications (Right): 0.66 mL bupivacaine  0.25 %; 3 mL lidocaine  1 %; 13.33 mg methylPREDNISolone  acetate 40 MG/ML Medications (Left): 0.66 mL bupivacaine  0.25 %; 3 mL lidocaine  1 %; 13.33 mg methylPREDNISolone  acetate 40 MG/ML      Clinical Data: No additional findings.   Subjective: Chief Complaint  Patient presents with   Right Knee - Pain   Left Knee - Pain    HPI patient is a pleasant 68 year old female who comes in today with recurrent bilateral knee pain left greater than right.  History of advanced arthritis to both knees.  She was seen by me in August of last year where both knees were injected with Durolane.  She had good but only temporary relief lasting 2 days.  Pain has gradually returned and is worsened over the past week after being at the beach.  She tells me she did a lot of stair climbing and walked in the sand.  The pain she is having is throughout the entire knee  but mostly to the medial aspect.  Symptoms are constant but worse with knee extension as well as walking.  She has tried ice and heat and has been taking Tylenol  arthritis with minimal relief.  Of note, she has undergone left knee cortisone injection in April 2020 for which helped for several months.  No previous cortisone injection to the right knee.  Review of Systems as detailed in HPI.  All others reviewed and are negative.   Objective: Vital Signs: There were no vitals taken for this visit.  Physical Exam well-developed well-nourished female in no acute distress.  Alert and oriented x 3.  Ortho Exam bilateral knee exam: Small effusion.  Range of motion 0 to 110 degrees.  Medial joint line tenderness.  Marked patellofemoral crepitus.  She is neurovascularly intact distally.  Specialty Comments:  No specialty comments available.  Imaging: No new imaging   PMFS History: Patient Active Problem List   Diagnosis Date Noted   Prediabetes 08/13/2022   Encounter for screening mammogram for malignant neoplasm of breast 03/22/2019   Urge incontinence 08/30/2018   Insomnia 12/20/2012   Chronic allergic rhinitis 03/20/2012   GERD (gastroesophageal reflux disease) 07/19/2011   Osteoarthritis of carpometacarpal joint of thumb 03/29/2011   Hyperlipidemia 01/26/2011   Healthcare maintenance 01/26/2011   Low  back pain 01/08/2011   Hypertension 11/25/2010   Scoliosis 11/25/2010   Osteoarthritis 11/25/2010   Past Medical History:  Diagnosis Date   Arthritis    Chest pain    Health maintenance examination    Healthcare maintenance    Hyperlipidemia    Hypertension    Low back pain    Obesity    Osteoarthritis    Other screening mammogram    Scoliosis    Tendonitis, Achilles, right    Wrist pain     Family History  Problem Relation Age of Onset   Diabetes Mother    Hypertension Mother    Cancer Cousin     Past Surgical History:  Procedure Laterality Date   BREAST LUMPECTOMY      TUBAL LIGATION     Social History   Occupational History   Not on file  Tobacco Use   Smoking status: Former    Current packs/day: 0.00    Average packs/day: 0.5 packs/day for 2.0 years (1.0 ttl pk-yrs)    Types: Cigarettes    Start date: 11/25/1986    Quit date: 11/24/1988    Years since quitting: 34.9   Smokeless tobacco: Never  Substance and Sexual Activity   Alcohol use: No   Drug use: No   Sexual activity: Not on file

## 2023-11-15 ENCOUNTER — Encounter: Payer: Self-pay | Admitting: *Deleted

## 2023-12-27 ENCOUNTER — Telehealth: Payer: Self-pay | Admitting: *Deleted

## 2023-12-27 DIAGNOSIS — K219 Gastro-esophageal reflux disease without esophagitis: Secondary | ICD-10-CM

## 2023-12-27 MED ORDER — PANTOPRAZOLE SODIUM 20 MG PO TBEC
20.0000 mg | DELAYED_RELEASE_TABLET | Freq: Every day | ORAL | 1 refills | Status: AC
Start: 2023-12-27 — End: ?

## 2023-12-27 NOTE — Telephone Encounter (Signed)
 Received refill request from pts pharmacy for pantoprazole  20mg  tablet Pt overdue for visit CMA attempted to contact pt with an appt  No answer Message left on recorder to call back for routine appt.  Refill request authorized

## 2024-03-05 ENCOUNTER — Encounter: Payer: Self-pay | Admitting: Radiology

## 2024-05-14 ENCOUNTER — Other Ambulatory Visit: Payer: Self-pay | Admitting: Nurse Practitioner

## 2024-05-14 ENCOUNTER — Ambulatory Visit
Admission: RE | Admit: 2024-05-14 | Discharge: 2024-05-14 | Disposition: A | Source: Ambulatory Visit | Attending: Nurse Practitioner

## 2024-05-14 DIAGNOSIS — R0602 Shortness of breath: Secondary | ICD-10-CM

## 2024-05-16 ENCOUNTER — Other Ambulatory Visit: Payer: Self-pay | Admitting: Infectious Diseases

## 2024-05-16 DIAGNOSIS — J309 Allergic rhinitis, unspecified: Secondary | ICD-10-CM
